# Patient Record
Sex: Female | Born: 1940 | Race: Black or African American | Hispanic: No | Marital: Married | Smoking: Former smoker
Health system: Southern US, Community
[De-identification: ages and names within clinical notes are randomized; demographics above are authoritative.]

## PROBLEM LIST (undated history)

## (undated) DIAGNOSIS — I10 Essential (primary) hypertension: Secondary | ICD-10-CM

## (undated) DIAGNOSIS — C801 Malignant (primary) neoplasm, unspecified: Secondary | ICD-10-CM

## (undated) HISTORY — PX: VAGINAL HYSTERECTOMY: SUR661

## (undated) HISTORY — PX: CATARACT EXTRACTION: SUR2

## (undated) HISTORY — DX: Essential (primary) hypertension: I10

---

## 2004-01-06 ENCOUNTER — Ambulatory Visit: Payer: Self-pay | Admitting: Family Medicine

## 2004-01-20 ENCOUNTER — Other Ambulatory Visit: Admission: RE | Admit: 2004-01-20 | Discharge: 2004-01-20 | Payer: Self-pay | Admitting: Family Medicine

## 2004-01-20 ENCOUNTER — Ambulatory Visit: Payer: Self-pay | Admitting: Family Medicine

## 2004-01-27 ENCOUNTER — Ambulatory Visit: Payer: Self-pay

## 2004-03-16 ENCOUNTER — Encounter: Admission: RE | Admit: 2004-03-16 | Discharge: 2004-03-16 | Payer: Self-pay | Admitting: Family Medicine

## 2004-03-27 ENCOUNTER — Encounter: Admission: RE | Admit: 2004-03-27 | Discharge: 2004-03-27 | Payer: Self-pay | Admitting: Family Medicine

## 2005-02-10 ENCOUNTER — Ambulatory Visit: Payer: Self-pay | Admitting: Family Medicine

## 2005-02-12 ENCOUNTER — Encounter: Admission: RE | Admit: 2005-02-12 | Discharge: 2005-02-12 | Payer: Self-pay | Admitting: Family Medicine

## 2005-04-07 ENCOUNTER — Ambulatory Visit: Payer: Self-pay | Admitting: Internal Medicine

## 2005-04-10 ENCOUNTER — Ambulatory Visit: Payer: Self-pay | Admitting: Pulmonary Disease

## 2005-04-26 ENCOUNTER — Encounter (INDEPENDENT_AMBULATORY_CARE_PROVIDER_SITE_OTHER): Payer: Self-pay | Admitting: Specialist

## 2005-04-26 ENCOUNTER — Ambulatory Visit: Payer: Self-pay | Admitting: Internal Medicine

## 2005-05-06 ENCOUNTER — Encounter: Payer: Self-pay | Admitting: Family Medicine

## 2005-05-06 ENCOUNTER — Ambulatory Visit: Payer: Self-pay | Admitting: Family Medicine

## 2005-05-06 ENCOUNTER — Other Ambulatory Visit: Admission: RE | Admit: 2005-05-06 | Discharge: 2005-05-06 | Payer: Self-pay | Admitting: Family Medicine

## 2005-12-23 ENCOUNTER — Ambulatory Visit: Payer: Self-pay | Admitting: Family Medicine

## 2006-05-03 ENCOUNTER — Ambulatory Visit: Payer: Self-pay | Admitting: Family Medicine

## 2006-07-29 DIAGNOSIS — K449 Diaphragmatic hernia without obstruction or gangrene: Secondary | ICD-10-CM | POA: Insufficient documentation

## 2006-08-01 ENCOUNTER — Encounter: Payer: Self-pay | Admitting: Family Medicine

## 2006-08-01 ENCOUNTER — Ambulatory Visit: Payer: Self-pay | Admitting: Family Medicine

## 2006-08-01 ENCOUNTER — Other Ambulatory Visit: Admission: RE | Admit: 2006-08-01 | Discharge: 2006-08-01 | Payer: Self-pay | Admitting: Family Medicine

## 2006-08-01 DIAGNOSIS — Z8611 Personal history of tuberculosis: Secondary | ICD-10-CM

## 2006-08-01 DIAGNOSIS — M25529 Pain in unspecified elbow: Secondary | ICD-10-CM

## 2006-08-09 ENCOUNTER — Encounter: Admission: RE | Admit: 2006-08-09 | Discharge: 2006-08-09 | Payer: Self-pay | Admitting: Family Medicine

## 2006-08-10 ENCOUNTER — Encounter (INDEPENDENT_AMBULATORY_CARE_PROVIDER_SITE_OTHER): Payer: Self-pay | Admitting: *Deleted

## 2006-08-12 LAB — CONVERTED CEMR LAB
ALT: 14 units/L (ref 0–40)
AST: 20 units/L (ref 0–37)
Albumin: 4 g/dL (ref 3.5–5.2)
Alkaline Phosphatase: 67 units/L (ref 39–117)
BUN: 9 mg/dL (ref 6–23)
Basophils Absolute: 0.1 10*3/uL (ref 0.0–0.1)
Calcium: 9.6 mg/dL (ref 8.4–10.5)
Chloride: 108 meq/L (ref 96–112)
Cholesterol: 194 mg/dL (ref 0–200)
Creatinine, Ser: 0.7 mg/dL (ref 0.4–1.2)
GFR calc non Af Amer: 89 mL/min
HCT: 43 % (ref 36.0–46.0)
LDL Cholesterol: 135 mg/dL — ABNORMAL HIGH (ref 0–99)
MCHC: 34.1 g/dL (ref 30.0–36.0)
Monocytes Relative: 9.2 % (ref 3.0–11.0)
Neutrophils Relative %: 36.4 % — ABNORMAL LOW (ref 43.0–77.0)
Platelets: 290 10*3/uL (ref 150–400)
RBC: 4.67 M/uL (ref 3.87–5.11)
RDW: 13.7 % (ref 11.5–14.6)
Total Bilirubin: 0.9 mg/dL (ref 0.3–1.2)
Total CHOL/HDL Ratio: 5.6
Triglycerides: 123 mg/dL (ref 0–149)

## 2008-03-11 IMAGING — CR DG CHEST 2V
2 series · 2 of 2 positions shown · non-contrast
Comparison: 02/12/05.

CLINICAL DATA: Positive PPD.
 TWO VIEW CHEST:

[view not recorded (1 of 2)]
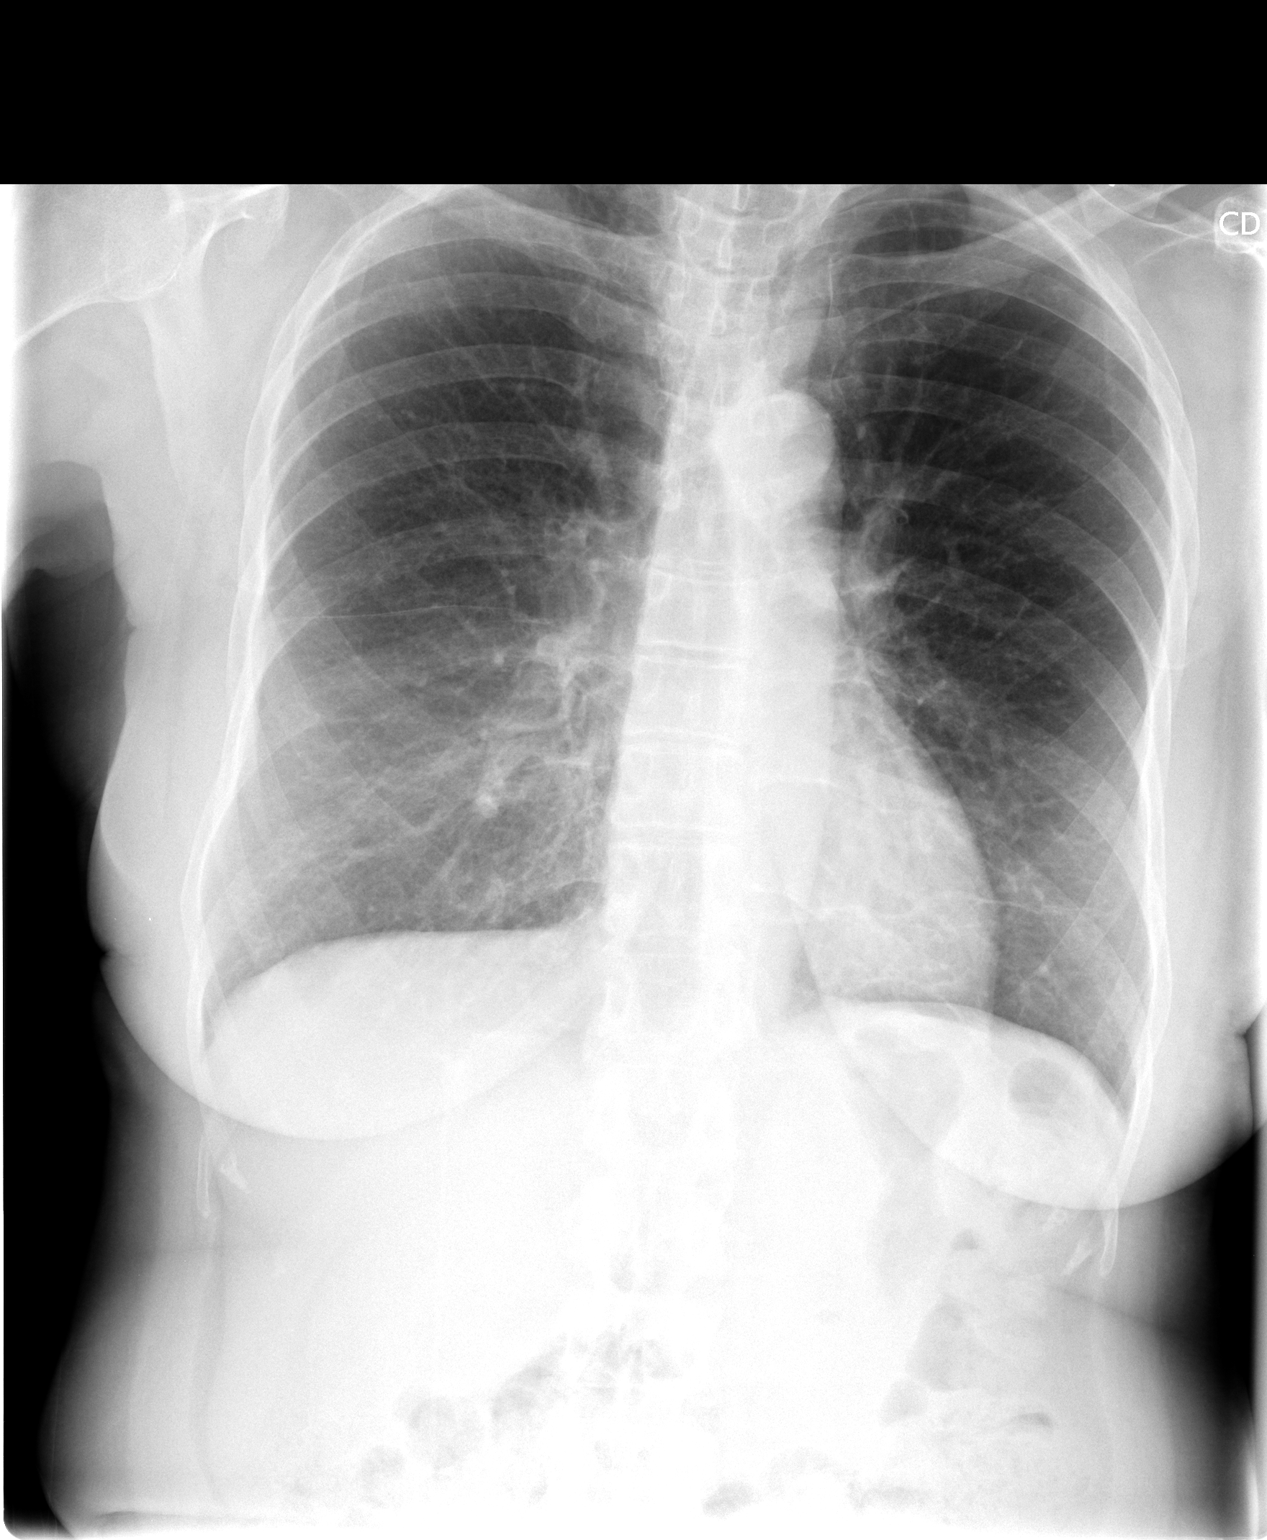

[view not recorded (2 of 2)]
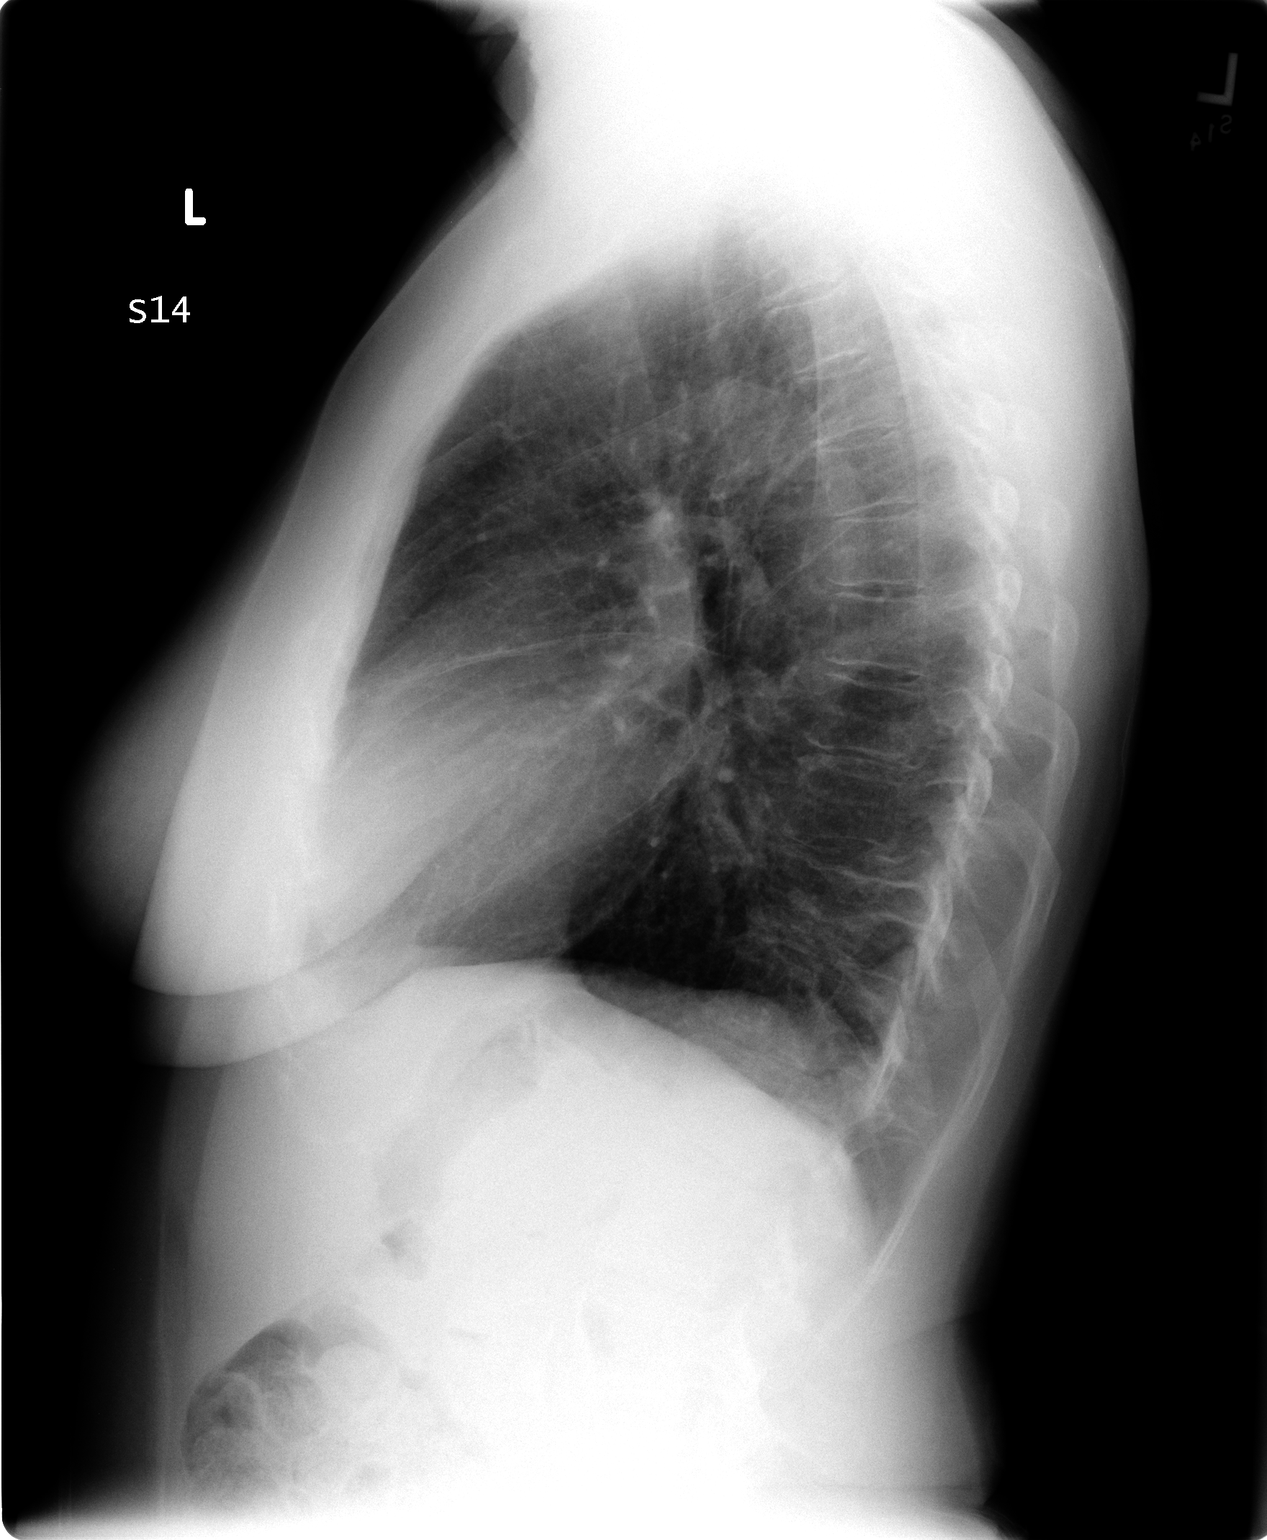

[2 of 2 positions shown; findings below may reference images not displayed]

FINDINGS: Trachea is midline.  Heart size normal.  Right paratracheal soft tissue density is unchanged.  Left basilar subsegmental atelectasis or scar.  Lungs are otherwise clear. No pleural fluid.
IMPRESSION: No evidence of active tuberculosis.

## 2009-05-17 ENCOUNTER — Ambulatory Visit: Payer: Self-pay | Admitting: Diagnostic Radiology

## 2009-05-17 ENCOUNTER — Emergency Department (HOSPITAL_BASED_OUTPATIENT_CLINIC_OR_DEPARTMENT_OTHER): Admission: EM | Admit: 2009-05-17 | Discharge: 2009-05-17 | Payer: Self-pay | Admitting: Emergency Medicine

## 2010-03-22 ENCOUNTER — Encounter: Payer: Self-pay | Admitting: Family Medicine

## 2010-05-20 ENCOUNTER — Encounter: Payer: Self-pay | Admitting: Internal Medicine

## 2010-05-28 NOTE — Letter (Signed)
Summary: Colonoscopy Date Change Letter  [facility]  [address] [place], [place] [address]   Phone: [phone]  Fax: [phone]      May 20, 2010 MRN: [id number]   [facility] [address] [place], [place]  [address]   Dear Ms. SULLIVAN,   Previously you were recommended to have a repeat colonoscopy around this time. Your chart was recently reviewed by Dr. Lina Sar of [facility]. Follow up colonoscopy is now recommended in February 2014. This revised recommendation is based on current, nationally recognized guidelines for colorectal cancer screening and polyp surveillance. These guidelines are endorsed by the American Cancer Society, [employer] on Colorectal Cancer as well as numerous other major medical organizations.  Please understand that our recommendation assumes that you do not have any new symptoms such as bleeding, a change in bowel habits, anemia, or significant abdominal discomfort. If you do have any concerning GI symptoms or want to discuss the guideline recommendations, please call to arrange an office visit at your earliest convenience. Otherwise we will keep you in our reminder system and contact you 1-2 months prior to the date listed above to schedule your next colonoscopy.  Thank you,  Hedwig Morton. Juanda Chance, M.D.  [facility] [phone]

## 2012-02-18 ENCOUNTER — Encounter: Payer: Self-pay | Admitting: Internal Medicine

## 2012-03-29 ENCOUNTER — Encounter: Payer: Self-pay | Admitting: Internal Medicine

## 2012-04-04 ENCOUNTER — Encounter: Payer: Self-pay | Admitting: Internal Medicine

## 2012-09-13 ENCOUNTER — Encounter: Payer: Self-pay | Admitting: Internal Medicine

## 2012-10-27 ENCOUNTER — Ambulatory Visit (AMBULATORY_SURGERY_CENTER): Payer: Federal, State, Local not specified - PPO

## 2012-10-27 VITALS — Ht 65.5 in | Wt 139.8 lb

## 2012-10-27 DIAGNOSIS — Z860100 Personal history of colon polyps, unspecified: Secondary | ICD-10-CM

## 2012-10-27 MED ORDER — MOVIPREP 100 G PO SOLR: 1.0000 | Freq: Once | ORAL | Status: DC

## 2012-11-01 ENCOUNTER — Encounter: Payer: Self-pay | Admitting: Internal Medicine

## 2012-11-10 ENCOUNTER — Encounter: Payer: Self-pay | Admitting: Internal Medicine

## 2012-11-10 ENCOUNTER — Ambulatory Visit (AMBULATORY_SURGERY_CENTER): Payer: Medicare Other | Admitting: Internal Medicine

## 2012-11-10 VITALS — BP 135/66 | HR 79 | Temp 97.1°F | Resp 18 | Ht 65.5 in | Wt 139.0 lb

## 2012-11-10 DIAGNOSIS — D126 Benign neoplasm of colon, unspecified: Secondary | ICD-10-CM

## 2012-11-10 DIAGNOSIS — Z86010 Personal history of colonic polyps: Secondary | ICD-10-CM

## 2012-11-10 MED ORDER — SODIUM CHLORIDE 0.9 % IV SOLN: 500.0000 mL | INTRAVENOUS | Status: DC

## 2012-11-10 NOTE — Op Note (Signed)
[facility] [address] [place], [address]   COLONOSCOPY PROCEDURE REPORT  PATIENT: Alyssa Gibson, Alyssa Gibson  MR#: [id number] BIRTHDATE: [birth year] , 72  yrs. old GENDER: Female ENDOSCOPIST: Hart Carwin, MD REFERRED QI:ONGEXBM Willa Rough, M.D. PROCEDURE DATE:  11/10/2012 PROCEDURE:   Colonoscopy, surveillance First Screening Colonoscopy - Avg.  risk and is 50 yrs.  old or older - No.  Prior Negative Screening - Now for repeat screening. N/A  History of Adenoma - Now for follow-up colonoscopy & has been > or = to 3 yrs.  N/A  Polyps Removed Today? Yes. ASA CLASS:   Class II INDICATIONS: hyperplastic polyp in 2007 MEDICATIONS: propofol (Diprivan) 200mg  IV  DESCRIPTION OF PROCEDURE:   After the risks benefits and alternatives of the procedure were thoroughly explained, informed consent was obtained.  A digital rectal exam revealed no abnormalities of the rectum.   The LB [id number]-H190 [id number]  endoscope was introduced through the anus and advanced to the cecum, which was identified by both the appendix and ileocecal valve. No adverse events experienced.   The quality of the prep was good, using MoviPrep  The instrument was then slowly withdrawn as the colon was fully examined.      COLON FINDINGS: A smooth sessile polyp ranging between 3-26mm in size was found.in the rectum.  A polypectomy was performed with cold forceps.  The resection was complete and the polyp tissue was completely retrieved.  Retroflexed views revealed no abnormalities. The time to cecum=6 minutes 10 seconds.  Withdrawal time=6 minutes 7 seconds.  The scope was withdrawn and the procedure completed. COMPLICATIONS: There were no complications.  ENDOSCOPIC IMPRESSION: Sessile polyp ranging between 3-67mm in size was found; polypectomy was performed with cold forceps  RECOMMENDATIONS: 1.  Await pathology results 2.   high fiber diet 3.  recall colonoscopy 10 years pending Bx   eSigned:  Hart Carwin, MD 11/10/2012 11:34 AM   cc:

## 2012-11-10 NOTE — Progress Notes (Signed)
Called to room to assist during endoscopic procedure.  Patient ID and intended procedure confirmed with present staff. Received instructions for my participation in the procedure from the performing physician.  

## 2012-11-10 NOTE — Progress Notes (Signed)
A/ox3 pleased with MAC, report to RN 

## 2012-11-10 NOTE — Progress Notes (Deleted)
Patient denies any allergies to eggs or soy. 

## 2012-11-10 NOTE — Patient Instructions (Addendum)
YOU HAD AN ENDOSCOPIC PROCEDURE TODAY AT THE [facility]: Refer to the procedure report that was given to you for any specific questions about what was found during the examination.  If the procedure report does not answer your questions, please call your gastroenterologist to clarify.  If you requested that your care partner not be given the details of your procedure findings, then the procedure report has been included in a sealed envelope for you to review at your convenience later.  YOU SHOULD EXPECT: Some feelings of bloating in the abdomen. Passage of more gas than usual.  Walking can help get rid of the air that was put into your GI tract during the procedure and reduce the bloating. If you had a lower endoscopy (such as a colonoscopy or flexible sigmoidoscopy) you may notice spotting of blood in your stool or on the toilet paper. If you underwent a bowel prep for your procedure, then you may not have a normal bowel movement for a few days.  DIET: Your first meal following the procedure should be a light meal and then it is ok to progress to your normal diet.  A half-sandwich or bowl of soup is an example of a good first meal.  Heavy or fried foods are harder to digest and may make you feel nauseous or bloated.  Likewise meals heavy in dairy and vegetables can cause extra gas to form and this can also increase the bloating.  Drink plenty of fluids but you should avoid alcoholic beverages for 24 hours.  ACTIVITY: Your care partner should take you home directly after the procedure.  You should plan to take it easy, moving slowly for the rest of the day.  You can resume normal activity the day after the procedure however you should NOT DRIVE or use heavy machinery for 24 hours (because of the sedation medicines used during the test).    SYMPTOMS TO REPORT IMMEDIATELY: A gastroenterologist can be reached at any hour.  During normal business hours, 8:30 AM to 5:00 PM Monday through Friday,  call [phone].  After hours and on weekends, please call the GI answering service at [phone] who will take a message and have the physician on call contact you.   Following lower endoscopy (colonoscopy or flexible sigmoidoscopy):  Excessive amounts of blood in the stool  Significant tenderness or worsening of abdominal pains  Swelling of the abdomen that is new, acute  Fever of 100F or higher    FOLLOW UP: If any biopsies were taken you will be contacted by phone or by letter within the next 1-3 weeks.  Call your gastroenterologist if you have not heard about the biopsies in 3 weeks.  Our staff will call the home number listed on your records the next business day following your procedure to check on you and address any questions or concerns that you may have at that time regarding the information given to you following your procedure. This is a courtesy call and so if there is no answer at the home number and we have not heard from you through the emergency physician on call, we will assume that you have returned to your regular daily activities without incident.  SIGNATURES/CONFIDENTIALITY: You and/or your care partner have signed paperwork which will be entered into your electronic medical record.  These signatures attest to the fact that that the information above on your After Visit Summary has been reviewed and is understood.  Full responsibility of the confidentiality   of this discharge information lies with you and/or your care-partner.     

## 2012-11-10 NOTE — Progress Notes (Signed)
Patient denies any allergies to eggs or soy. Patient denies any problems with prior anesthesia.  

## 2012-11-10 NOTE — Progress Notes (Signed)
Patient did not have preoperative order for IV antibiotic SSI prophylaxis. (G8918)  Patient did not experience any of the following events: a burn prior to discharge; a fall within the facility; wrong site/side/patient/procedure/implant event; or a hospital transfer or hospital admission upon discharge from the facility. (G8907)  

## 2012-11-13 ENCOUNTER — Telehealth: Payer: Self-pay

## 2012-11-13 NOTE — Telephone Encounter (Signed)
  Follow up Call-  Call back number 11/10/2012  Post procedure Call Back phone  # [phone]  Permission to leave phone message Yes     Patient questions:  Do you have a fever, pain , or abdominal swelling? no Pain Score  0 *  Have you tolerated food without any problems? yes  Have you been able to return to your normal activities? yes  Do you have any questions about your discharge instructions: Diet   no Medications  no Follow up visit  no  Do you have questions or concerns about your Care? no  Actions: * If pain score is 4 or above: No action needed, pain <4.  Per the pt, "I did fine". Maw

## 2012-11-14 ENCOUNTER — Encounter: Payer: Self-pay | Admitting: Internal Medicine

## 2014-08-05 ENCOUNTER — Encounter: Payer: Self-pay | Admitting: Internal Medicine

## 2022-03-07 ENCOUNTER — Other Ambulatory Visit: Payer: Self-pay

## 2022-03-07 ENCOUNTER — Encounter (HOSPITAL_BASED_OUTPATIENT_CLINIC_OR_DEPARTMENT_OTHER): Payer: Self-pay | Admitting: Emergency Medicine

## 2022-03-07 ENCOUNTER — Emergency Department (HOSPITAL_BASED_OUTPATIENT_CLINIC_OR_DEPARTMENT_OTHER)
Admission: EM | Admit: 2022-03-07 | Discharge: 2022-03-07 | Disposition: A | Discharge status: Home / Self Care | Payer: Medicare Other | Attending: Emergency Medicine | Admitting: Emergency Medicine

## 2022-03-07 DIAGNOSIS — M546 Pain in thoracic spine: Secondary | ICD-10-CM | POA: Insufficient documentation

## 2022-03-07 DIAGNOSIS — M62830 Muscle spasm of back: Secondary | ICD-10-CM | POA: Diagnosis not present

## 2022-03-07 DIAGNOSIS — M549 Dorsalgia, unspecified: Secondary | ICD-10-CM | POA: Diagnosis present

## 2022-03-07 DIAGNOSIS — G8929 Other chronic pain: Secondary | ICD-10-CM | POA: Insufficient documentation

## 2022-03-07 LAB — URINALYSIS, ROUTINE W REFLEX MICROSCOPIC
Bilirubin Urine: NEGATIVE
Glucose, UA: NEGATIVE mg/dL
Hgb urine dipstick: NEGATIVE
Ketones, ur: NEGATIVE mg/dL
Leukocytes,Ua: NEGATIVE
Nitrite: NEGATIVE
Protein, ur: NEGATIVE mg/dL
Specific Gravity, Urine: 1.02 (ref 1.005–1.030)
pH: 5.5 (ref 5.0–8.0)

## 2022-03-07 MED ORDER — CYCLOBENZAPRINE HCL 10 MG PO TABS: 10.0000 mg | ORAL_TABLET | Freq: Two times a day (BID) | ORAL | 0 refills | Status: AC | PRN

## 2022-03-07 MED ORDER — METHYLPREDNISOLONE SODIUM SUCC 125 MG IJ SOLR
125.0000 mg | Freq: Once | INTRAMUSCULAR | Status: AC
  Administered 2022-03-07: 125 mg via INTRAMUSCULAR
  Filled 2022-03-07: qty 2

## 2022-03-07 MED ORDER — CYCLOBENZAPRINE HCL 10 MG PO TABS
10.0000 mg | ORAL_TABLET | Freq: Once | ORAL | Status: AC
  Administered 2022-03-07: 10 mg via ORAL
  Filled 2022-03-07: qty 1

## 2022-03-07 NOTE — ED Triage Notes (Signed)
Pt reports back and shoulder spasms for the past 3 weeks. Has been to PCP for this. Pt says the only thing that stopped pain was what she thinks was a cortisone shot. Has had MRI for this but has not gotten result back yet. Also has noticed a foul smell to urine

## 2022-03-07 NOTE — Discharge Instructions (Addendum)
Thank you for allow me to be part of your care today.  You received a steroid injection which will work over the next several days to help with your back and shoulder pain.  We also gave you Flexeril, I am sending you home with a prescription of Flexeril that you will take twice daily as needed for muscle spasms.  I recommend following up with your primary care physician to discuss your MRI results and possible referral for physical therapy to help with your chronic back pain.  I recommend taking 800 mg of ibuprofen every 8 hours as needed for inflammation and pain.  Do not exceed 3200 mg in a 24-hour period as this will increase your risk for adverse effects.  I have attached some rehab exercises for mid back pain that you may try, if it worsens your symptoms, discontinue exercises.   Return to the ER if you develop any worsening of your symptoms or have any new concerns.

## 2022-03-07 NOTE — ED Notes (Signed)
Presents with rt shoulder and left flank pain, intermittent sharp with spasm type demonstration, able to ambulate with assistance, has full ROM of RUE, Able to place wt on both lower extremities. Pain is a 10 out of 0-10 scale at both areas, facial grimacing noted. States she has been dealing with this pain for the past 3 weeks, has been to an MD and rec some injections and changes in medication with minimal relief

## 2022-03-07 NOTE — ED Provider Notes (Signed)
[facility] EMER[facility]ENCY DEPARTMENT Provider Note   CSN: [id number] Arrival date & time: 03/07/22  0911     History  Chief Complaint  Patient presents with   Back Pain    Alyssa Gibson is a 82 y.o. female presents to the ED complaining of back and shoulder pain with spasms that has been ongoing for the past 3 weeks.  Patient has been seen by here PCP for this and had an MRI done at the end of December, but has not gotten her results back yet.  She states that she believes she has received a cortisone shot in the past for the pain and that seemed to help.  She has also noticed a foul smell to her urine and is concerned for UTI.  Denies fall or injury, heavy lifting.  Denies fever, weakness, numbness.        Home Medications Prior to Admission medications   Medication Sig Start Date End Date Taking? Authorizing Provider  cyclobenzaprine (FLEXERIL) 10 MG tablet Take 1 tablet (10 mg total) by mouth 2 (two) times daily as needed for muscle spasms. 03/07/22  Yes Cambri Plourde R, PA  AMLODIPINE BESYLATE PO Take by mouth.    [provider]  cholecalciferol (VITAMIN D) 400 UNITS TABS tablet Take 400 Units by mouth.    [provider]  fish oil-omega-3 fatty acids 1000 MG capsule Take 2 g by mouth daily.    [provider]  vitamin B-12 (CYANOCOBALAMIN) 1000 MCG tablet Take 1,000 mcg by mouth daily.    [provider]      Allergies    Penicillins    Review of Systems   Review of Systems  Constitutional:  Negative for fever.  Musculoskeletal:  Positive for arthralgias, back pain and neck pain.       Back and shoulder muscle spasms  Neurological:  Negative for weakness and numbness.    Physical Exam Updated Vital Signs BP (!) 148/78   Pulse 93   Temp 98.1 F (36.7 C) (Oral)   Resp 20   SpO2 100%  Physical Exam Vitals and nursing note reviewed.  Constitutional:      General: She is not in acute distress.    Appearance: Normal  appearance. She is not ill-appearing or diaphoretic.  Cardiovascular:     Rate and Rhythm: Normal rate and regular rhythm.  Pulmonary:     Effort: Pulmonary effort is normal.  Abdominal:     General: Abdomen is flat. Bowel sounds are normal.     Palpations: Abdomen is soft.     Tenderness: There is no abdominal tenderness.  Musculoskeletal:     Cervical back: No torticollis, tenderness, bony tenderness or crepitus. Pain with movement present. Normal range of motion.     Thoracic back: Spasms, tenderness and bony tenderness present. No deformity. Decreased range of motion.     Lumbar back: No tenderness or bony tenderness.       Back:  Skin:    General: Skin is warm and dry.     Capillary Refill: Capillary refill takes less than 2 seconds.  Neurological:     Mental Status: She is alert and oriented to person, place, and time. Mental status is at baseline.  Psychiatric:        Mood and Affect: Mood normal.        Behavior: Behavior normal.     ED Results / Procedures / Treatments   Labs (all labs ordered are listed, but only abnormal  results are displayed) Labs Reviewed  URINALYSIS, ROUTINE W REFLEX MICROSCOPIC    EKG None  Radiology No results found.  Procedures Procedures    Medications Ordered in ED Medications  methylPREDNISolone sodium succinate (SOLU-MEDROL) 125 mg/2 mL injection 125 mg (125 mg Intramuscular Given 03/07/22 1217)  cyclobenzaprine (FLEXERIL) tablet 10 mg (10 mg Oral Given 03/07/22 1216)    ED Course/ Medical Decision Making/ A&P                           Medical Decision Making Amount and/or Complexity of Data Reviewed Labs: ordered.   This patient presents to the ED with chief complaint(s) of thoracic back pain with right shoulder pain, and foul-smelling urine.  Patient has been evaluated by her primary care provider for similar back pain and muscle spasms.  She has been taking muscle relaxers and had an MRI performed at the end of December.   Patient states that the only thing that stopped the pain in the past was cortisone shot and some other injection she cannot recall.  Patient has to travel to [facility] tomorrow to assist her husband with beginning cancer treatments.  The complaint involves an extensive differential diagnosis and also carries with it a high risk of complications and morbidity.    The differential diagnosis includes spondylosis, spondylolisthesis, osteoarthritis, muscular strain, muscle spasm  The initial plan is to treat patient's pain with steroid injection and muscle relaxers  Additional history obtained: Additional history obtained from family, patient's daughter at bedside Records reviewed  MRI results from 02/24/2022 which shows thoracic spondylosis without significant spinal stenosis.  There is some foraminal narrowing bilaterally due to spurring.  Initial Assessment:   Exam significant for a patient that appears very uncomfortable, but is in no acute distress.  She does have bony tenderness and paraspinal tenderness in the thoracic region along the right scapula.  She is able to pull herself up to a sitting position in bed without assistance, but does experience increased discomfort.  Range of motion of neck is normal.  Range of motion of thoracic spine is limited due to pain.  She has 5/5 strength in bilateral upper and lower extremities.  Independent ECG/labs interpretation:  The following labs were independently interpreted:  Not indicated  Independent visualization and interpretation of imaging: I independently visualized the following imaging with scope of interpretation limited to determining acute life threatening conditions related to emergency care: Not indicated due to patient having a recent MRI done on 02/24/2022 which demonstrated thoracic spondylosis without significant spinal stenosis.  Treatment and Reassessment: Will treat patient while in ED with steroid injection and flexeril to reduce her  pain from muscle spasms. Patient has been taking tizanidine as a muscle relaxer with mixed results.  Upon reassessment, patient reports feeling much better and her pain has significantly improved.  Will prescribe flexeril to replace patient's tizanidine.    Disposition:   The patient has been appropriately medically screened and/or stabilized in the ED. I have low suspicion for any other emergent medical condition which would require further screening, evaluation or treatment in the ED or require inpatient management. At time of discharge the patient is hemodynamically stable and in no acute distress. I have discussed work-up results and diagnosis with patient and answered all questions. Patient is agreeable with discharge plan. We discussed strict return precautions for returning to the emergency department and they verbalized understanding.  Advised patient to schedule follow-up appointment with her primary  care provider to discuss MRI results and possible need for physical therapy referral.  Patient was taking tizanidine with mixed results in muscle spasm control, responded well to Flexeril while in ED.  Will prescribe short course of Flexeril to help with symptoms.  Discussed with patient supportive care measures including the use of heat and ibuprofen for musculoskeletal pain.  Patient verbally expressed her understanding and is in agreement with plan of discharge.          Final Clinical Impression(s) / ED Diagnoses Final diagnoses:  Chronic right-sided thoracic back pain  Muscle spasm of back    Rx / DC Orders ED Discharge Orders          Ordered    cyclobenzaprine (FLEXERIL) 10 MG tablet  2 times daily PRN        03/07/22 1309              Theressa Stamps [place], [place] 03/07/22 1309    Regan Lemming, MD 03/07/22 [phone]

## 2023-04-07 ENCOUNTER — Emergency Department (HOSPITAL_BASED_OUTPATIENT_CLINIC_OR_DEPARTMENT_OTHER): Payer: Medicare Other

## 2023-04-07 ENCOUNTER — Encounter (HOSPITAL_BASED_OUTPATIENT_CLINIC_OR_DEPARTMENT_OTHER): Payer: Self-pay | Admitting: Emergency Medicine

## 2023-04-07 ENCOUNTER — Inpatient Hospital Stay (HOSPITAL_BASED_OUTPATIENT_CLINIC_OR_DEPARTMENT_OTHER)
Admission: EM | Admit: 2023-04-07 | Discharge: 2023-04-10 | DRG: 291 | Disposition: A | Discharge status: Home Health | Payer: Medicare Other | Attending: Internal Medicine | Admitting: Internal Medicine

## 2023-04-07 ENCOUNTER — Other Ambulatory Visit: Payer: Self-pay

## 2023-04-07 DIAGNOSIS — Z9841 Cataract extraction status, right eye: Secondary | ICD-10-CM | POA: Diagnosis not present

## 2023-04-07 DIAGNOSIS — J449 Chronic obstructive pulmonary disease, unspecified: Secondary | ICD-10-CM

## 2023-04-07 DIAGNOSIS — Z902 Acquired absence of lung [part of]: Secondary | ICD-10-CM

## 2023-04-07 DIAGNOSIS — Z79899 Other long term (current) drug therapy: Secondary | ICD-10-CM

## 2023-04-07 DIAGNOSIS — I5021 Acute systolic (congestive) heart failure: Secondary | ICD-10-CM | POA: Diagnosis present

## 2023-04-07 DIAGNOSIS — I509 Heart failure, unspecified: Secondary | ICD-10-CM | POA: Diagnosis not present

## 2023-04-07 DIAGNOSIS — Z634 Disappearance and death of family member: Secondary | ICD-10-CM | POA: Diagnosis not present

## 2023-04-07 DIAGNOSIS — J9601 Acute respiratory failure with hypoxia: Principal | ICD-10-CM

## 2023-04-07 DIAGNOSIS — Z880 Allergy status to penicillin: Secondary | ICD-10-CM | POA: Diagnosis not present

## 2023-04-07 DIAGNOSIS — Z87891 Personal history of nicotine dependence: Secondary | ICD-10-CM

## 2023-04-07 DIAGNOSIS — Z85118 Personal history of other malignant neoplasm of bronchus and lung: Secondary | ICD-10-CM | POA: Diagnosis not present

## 2023-04-07 DIAGNOSIS — I110 Hypertensive heart disease with heart failure: Secondary | ICD-10-CM | POA: Diagnosis present

## 2023-04-07 DIAGNOSIS — I10 Essential (primary) hypertension: Secondary | ICD-10-CM | POA: Diagnosis not present

## 2023-04-07 DIAGNOSIS — G4700 Insomnia, unspecified: Secondary | ICD-10-CM

## 2023-04-07 DIAGNOSIS — Z9842 Cataract extraction status, left eye: Secondary | ICD-10-CM

## 2023-04-07 DIAGNOSIS — Z8611 Personal history of tuberculosis: Secondary | ICD-10-CM

## 2023-04-07 DIAGNOSIS — E785 Hyperlipidemia, unspecified: Secondary | ICD-10-CM | POA: Diagnosis present

## 2023-04-07 DIAGNOSIS — Z90710 Acquired absence of both cervix and uterus: Secondary | ICD-10-CM | POA: Diagnosis not present

## 2023-04-07 DIAGNOSIS — I5031 Acute diastolic (congestive) heart failure: Secondary | ICD-10-CM | POA: Diagnosis not present

## 2023-04-07 HISTORY — DX: Malignant (primary) neoplasm, unspecified: C80.1

## 2023-04-07 LAB — CBC
HCT: 43.7 % (ref 36.0–46.0)
Hemoglobin: 14.4 g/dL (ref 12.0–15.0)
MCH: 29.6 pg (ref 26.0–34.0)
MCHC: 33 g/dL (ref 30.0–36.0)
MCV: 89.7 fL (ref 80.0–100.0)
Platelets: 321 10*3/uL (ref 150–400)
RBC: 4.87 MIL/uL (ref 3.87–5.11)
RDW: 15.4 % (ref 11.5–15.5)
WBC: 10.3 10*3/uL (ref 4.0–10.5)
nRBC: 0 % (ref 0.0–0.2)

## 2023-04-07 LAB — COMPREHENSIVE METABOLIC PANEL
ALT: 15 U/L (ref 0–44)
AST: 20 U/L (ref 15–41)
Albumin: 3.6 g/dL (ref 3.5–5.0)
Alkaline Phosphatase: 54 U/L (ref 38–126)
Anion gap: 6 (ref 5–15)
BUN: 19 mg/dL (ref 8–23)
CO2: 25 mmol/L (ref 22–32)
Calcium: 9 mg/dL (ref 8.9–10.3)
Chloride: 108 mmol/L (ref 98–111)
Creatinine, Ser: 0.83 mg/dL (ref 0.44–1.00)
GFR, Estimated: >60 mL/min (ref >60)
Glucose, Bld: 115 mg/dL — ABNORMAL HIGH (ref 70–99)
Potassium: 4 mmol/L (ref 3.5–5.1)
Sodium: 139 mmol/L (ref 135–145)
Total Bilirubin: 0.9 mg/dL (ref 0.0–1.2)
Total Protein: 6.7 g/dL (ref 6.5–8.1)

## 2023-04-07 LAB — TROPONIN I (HIGH SENSITIVITY)
Troponin I (High Sensitivity): 41 ng/L — ABNORMAL HIGH (ref <18)
Troponin I (High Sensitivity): 76 ng/L — ABNORMAL HIGH (ref <18)

## 2023-04-07 LAB — D-DIMER, QUANTITATIVE: D-Dimer, Quant: 0.4 ug{FEU}/mL (ref 0.00–0.50)

## 2023-04-07 LAB — BRAIN NATRIURETIC PEPTIDE: B Natriuretic Peptide: 457.7 pg/mL — ABNORMAL HIGH (ref 0.0–100.0)

## 2023-04-07 MED ORDER — TRAZODONE HCL 100 MG PO TABS
100.0000 mg | ORAL_TABLET | Freq: Every day | ORAL | Status: DC
  Administered 2023-04-07 – 2023-04-09 (×3): 100 mg via ORAL
  Filled 2023-04-07 (×3): qty 1

## 2023-04-07 MED ORDER — AMLODIPINE BESYLATE 10 MG PO TABS
10.0000 mg | ORAL_TABLET | Freq: Every day | ORAL | Status: DC
  Filled 2023-04-07: qty 1

## 2023-04-07 MED ORDER — ALBUTEROL SULFATE HFA 108 (90 BASE) MCG/ACT IN AERS: 2.0000 | INHALATION_SPRAY | RESPIRATORY_TRACT | Status: DC | PRN

## 2023-04-07 MED ORDER — FUROSEMIDE 10 MG/ML IJ SOLN
40.0000 mg | Freq: Once | INTRAMUSCULAR | Status: AC
  Administered 2023-04-07: 40 mg via INTRAVENOUS
  Filled 2023-04-07: qty 4

## 2023-04-07 MED ORDER — ACETAMINOPHEN 325 MG PO TABS: 650.0000 mg | ORAL_TABLET | ORAL | Status: DC | PRN

## 2023-04-07 MED ORDER — TIMOLOL MALEATE 0.5 % OP SOLN
1.0000 [drp] | Freq: Every day | OPHTHALMIC | Status: DC
  Administered 2023-04-08 – 2023-04-10 (×3): 1 [drp] via OPHTHALMIC
  Filled 2023-04-07: qty 5

## 2023-04-07 MED ORDER — ONDANSETRON HCL 4 MG/2ML IJ SOLN: 4.0000 mg | Freq: Four times a day (QID) | INTRAMUSCULAR | Status: DC | PRN

## 2023-04-07 MED ORDER — SODIUM CHLORIDE 0.9% FLUSH
3.0000 mL | Freq: Two times a day (BID) | INTRAVENOUS | Status: DC
  Administered 2023-04-07 – 2023-04-10 (×7): 3 mL via INTRAVENOUS

## 2023-04-07 MED ORDER — IPRATROPIUM-ALBUTEROL 0.5-2.5 (3) MG/3ML IN SOLN: 3.0000 mL | RESPIRATORY_TRACT | Status: DC | PRN

## 2023-04-07 MED ORDER — ENOXAPARIN SODIUM 40 MG/0.4ML IJ SOSY
40.0000 mg | PREFILLED_SYRINGE | Freq: Every day | INTRAMUSCULAR | Status: DC
  Administered 2023-04-07 – 2023-04-10 (×4): 40 mg via SUBCUTANEOUS
  Filled 2023-04-07 (×4): qty 0.4

## 2023-04-07 MED ORDER — SODIUM CHLORIDE 0.9% FLUSH: 3.0000 mL | INTRAVENOUS | Status: DC | PRN

## 2023-04-07 MED ORDER — CYCLOBENZAPRINE HCL 10 MG PO TABS: 10.0000 mg | ORAL_TABLET | Freq: Two times a day (BID) | ORAL | Status: DC | PRN

## 2023-04-07 MED ORDER — ALBUTEROL SULFATE (2.5 MG/3ML) 0.083% IN NEBU: 2.5000 mg | INHALATION_SOLUTION | RESPIRATORY_TRACT | Status: DC | PRN

## 2023-04-07 MED ORDER — SODIUM CHLORIDE 0.9 % IV SOLN: 250.0000 mL | INTRAVENOUS | Status: AC | PRN

## 2023-04-07 MED ORDER — FUROSEMIDE 10 MG/ML IJ SOLN
40.0000 mg | Freq: Two times a day (BID) | INTRAMUSCULAR | Status: DC
  Administered 2023-04-07: 40 mg via INTRAVENOUS
  Filled 2023-04-07 (×2): qty 4

## 2023-04-07 NOTE — ED Notes (Signed)
 Transport truck arrived, transport truck had to be redirected. New transport truck contacted

## 2023-04-07 NOTE — ED Triage Notes (Signed)
 Pt has been having SOB for "a while" with exertion, worse at the beginning of the week. Seen by PCP and given meds. Was called and told may have a CHF and has an ECHO scheduled. State SOB is worse and unable to sleep.

## 2023-04-07 NOTE — ED Provider Notes (Addendum)
 [place] EMERGENCY DEPARTMENT AT [facility] Provider Note  CSN: [id number] Arrival date & time: 04/07/23 0414  Chief Complaint(s) Shortness of Breath  HPI Alyssa Gibson is a 83 y.o. female with a past medical history listed below including COPD not on supplemental oxygen, prior lung cancer status post lobectomy not requiring chemo or radiation.  She presents today for shortness of breath.  Ongoing for several months and worse with exertion.  Also endorses orthopnea.  Became more severe 3 days ago prompting a visit to her PCP.  There were concern for possible heart failure and scheduled her for echo and stress test.  Patient endorses productive cough without fevers.  No associated chest pain.  She does endorse dependent lower extremity edema improved by leg raising.  The history is provided by the patient.   On review of records, echo from 2023 showed a EF of 50 to 55%.  Patient also had a reassuring stress test in 2019.  Past Medical History Past Medical History:  Diagnosis Date   Cancer [facility])    Hypertension    Patient Active Problem List   Diagnosis Date Noted   ELBOW PAIN, LEFT 08/01/2006   HX, PERSONAL, TUBERCULOSIS 08/01/2006   Diaphragmatic hernia 07/29/2006   Home Medication(s) Prior to Admission medications   Medication Sig Start Date End Date Taking? Authorizing Provider  AMLODIPINE  BESYLATE PO Take by mouth.    [provider]  cholecalciferol (VITAMIN D) 400 UNITS TABS tablet Take 400 Units by mouth.    [provider]  cyclobenzaprine  (FLEXERIL ) 10 MG tablet Take 1 tablet (10 mg total) by mouth 2 (two) times daily as needed for muscle spasms. 03/07/22   Clark, Meghan R, PA-C  fish oil-omega-3 fatty acids 1000 MG capsule Take 2 g by mouth daily.    [provider]  vitamin B-12 (CYANOCOBALAMIN) 1000 MCG tablet Take 1,000 mcg by mouth daily.    [provider]                                                                                                                                     Allergies Penicillins  Review of Systems Review of Systems As noted in HPI  Physical Exam Vital Signs  I have reviewed the triage vital signs BP (!) 144/91   Pulse (!) 104   Temp 98.3 F (36.8 C) (Oral)   Resp (!) 25   Ht 5' 5.5 (1.664 m)   Wt 58.1 kg   SpO2 96%   BMI 20.98 kg/m   Physical Exam Vitals reviewed.  Constitutional:      General: She is not in acute distress.    Appearance: She is well-developed. She is not diaphoretic.  HENT:     Head: Normocephalic and atraumatic.     Nose: Nose normal.  Eyes:     General: No scleral icterus.       Right eye: No  discharge.        Left eye: No discharge.     Conjunctiva/sclera: Conjunctivae normal.     Pupils: Pupils are equal, round, and reactive to light.  Cardiovascular:     Rate and Rhythm: Regular rhythm. Tachycardia present.     Heart sounds: No murmur heard.    No friction rub. No gallop.  Pulmonary:     Effort: Pulmonary effort is normal. No respiratory distress.     Breath sounds: No stridor. Examination of the right-upper field reveals wheezing. Examination of the left-upper field reveals wheezing. Examination of the left-middle field reveals rhonchi. Examination of the right-lower field reveals rhonchi. Wheezing and rhonchi present. No rales.  Abdominal:     General: There is no distension.     Palpations: Abdomen is soft.     Tenderness: There is no abdominal tenderness.  Musculoskeletal:        General: No tenderness.     Cervical back: Normal range of motion and neck supple.     Right lower leg: No edema.     Left lower leg: No edema.  Skin:    General: Skin is warm and dry.     Findings: No erythema or rash.  Neurological:     Mental Status: She is alert and oriented to person, place, and time.     ED Results and Treatments Labs (all labs ordered are listed, but only abnormal results are displayed) Labs Reviewed  BRAIN  NATRIURETIC PEPTIDE - Abnormal; Notable for the following components:      Result Value   B Natriuretic Peptide 457.7 (*)    All other components within normal limits  COMPREHENSIVE METABOLIC PANEL - Abnormal; Notable for the following components:   Glucose, Bld 115 (*)    All other components within normal limits  TROPONIN I (HIGH SENSITIVITY) - Abnormal; Notable for the following components:   Troponin I (High Sensitivity) 41 (*)    All other components within normal limits  CBC  D-DIMER, QUANTITATIVE (NOT AT [facility])  TROPONIN I (HIGH SENSITIVITY)                                                                                                                         EKG  EKG Interpretation Date/Time:  Thursday April 07 2023 04:32:45 EST Ventricular Rate:  120 PR Interval:  136 QRS Duration:  108 QT Interval:  299 QTC Calculation: 423 R Axis:   0  Text Interpretation: Sinus tachycardia Ventricular premature complex Probable anteroseptal infarct, recent Lateral leads are also involved No old tracing to compare Confirmed by Trine Likes [phone]) on 04/07/2023 5:15:58 AM       Radiology DG Chest 2 View Result Date: 04/07/2023 CLINICAL DATA:  Shortness of breath. EXAM: CHEST - 2 VIEW COMPARISON:  08/09/2006 FINDINGS: Cardiopericardial silhouette is at upper limits of normal for size. Basilar predominant interstitial with some potential minimal airspace disease at the right base. No substantial pleural effusion. Bones are diffusely demineralized.  Telemetry leads overlie the chest. IMPRESSION: Basilar predominant interstitial opacity bilaterally, right greater than left. Findings likely reflect chronic changes although a component of superimposed edema is not excluded. Subtle airspace disease in the extreme right lung base compatible with atelectasis or infection. Electronically Signed   By: Camellia Candle M.D.   On: 04/07/2023 05:12    Medications Ordered in ED Medications  albuterol   (VENTOLIN  HFA) 108 (90 Base) MCG/ACT inhaler 2 puff (has no administration in time range)  furosemide  (LASIX ) injection 40 mg (40 mg Intravenous Given 04/07/23 9362)   Procedures Procedures  (including critical care time) Medical Decision Making / ED Course   Medical Decision Making Amount and/or Complexity of Data Reviewed Labs: ordered. Decision-making details documented in ED Course. Radiology: ordered and independent interpretation performed. Decision-making details documented in ED Course. ECG/medicine tests: ordered and independent interpretation performed. Decision-making details documented in ED Course.  Risk Prescription drug management. Decision regarding hospitalization.    Shortness of breath differential diagnosis and workup considered  Patient is satting 90% on room air.  Improved on 2 L nasal cannula.  Noted to be tachycardic. Workup favors heart failure over COPD, though possible superimposed.  Appears to be new onset HF.  Started on IV Lasix . Will need admission for further workup and management.  EKG shows sinus tachycardia without acute ischemic changes.  Troponin was slightly elevated 41, favored to be demand. Dimer was negative making PE unlikely.  CBC without leukocytosis or severe anemia.  Metabolic panel without significant electrolyte derangements or renal sufficiency.   Clinical Course as of 04/07/23 0731  Thu Apr 07, 2023  0729 Spoke with Dr. Claudene from Hospitalist service. [PC]    Clinical Course User Index [PC] Lenix Benoist, Raynell Moder, MD    Final Clinical Impression(s) / ED Diagnoses Final diagnoses:  Acute respiratory failure with hypoxia (HCC)  Acute congestive heart failure, unspecified heart failure type [facility])    This chart was dictated using voice recognition software.  Despite best efforts to proofread,  errors can occur which can change the documentation meaning.      Trine Raynell Moder, MD 04/07/23 [phone]

## 2023-04-07 NOTE — Plan of Care (Addendum)
 [facility] transfer for admission at the request of Dr. Trine. Alyssa Gibson is a 83 year old female pmh COPD, lung cancer s/p lobectomy approximately 5 years ago w/o need of radiation or chemo, presented with shortness of breath, peripheral edema, and orthopnea.  Patient saw PCP this week who ordered outpatient echo and stress test, but symptoms worsen for which [facility].  O2 saturations noted to be as low as 90% for which patient was placed on supplemental oxygen, with improvement.  Labs significant for BNP 457.7,  HS-trop 41->76, D-dimer reassuring at 0.4. Chest x-ray significant for bibasilar predominant interstitial opacities bilaterally right greater than left thought to reflect edema, and subtle airspace disease in the extreme right lung base compatible with atelectasis or infection.  Patient was given albuterol  breathing treatment and Lasix  40 mg IV.  Orders placed for inpatient to a cardiac telemetry bed for treatment of presumed CHF exacerbation.

## 2023-04-07 NOTE — ED Notes (Signed)
 ED Provider at bedside.

## 2023-04-07 NOTE — Plan of Care (Signed)
  Problem: Education: Goal: Knowledge of General Education information will improve Description: Including pain rating scale, medication(s)/side effects and non-pharmacologic comfort measures Outcome: Not Progressing   Problem: Health Behavior/Discharge Planning: Goal: Ability to manage health-related needs will improve Outcome: Not Progressing   Problem: Clinical Measurements: Goal: Ability to maintain clinical measurements within normal limits will improve Outcome: Not Progressing Goal: Will remain free from infection Outcome: Not Progressing Goal: Diagnostic test results will improve Outcome: Not Progressing Goal: Respiratory complications will improve Outcome: Not Progressing Goal: Cardiovascular complication will be avoided Outcome: Not Progressing   Problem: Activity: Goal: Risk for activity intolerance will decrease Outcome: Not Progressing   Problem: Nutrition: Goal: Adequate nutrition will be maintained Outcome: Not Progressing   Problem: Coping: Goal: Level of anxiety will decrease Outcome: Not Progressing   Problem: Elimination: Goal: Will not experience complications related to bowel motility Outcome: Not Progressing Goal: Will not experience complications related to urinary retention Outcome: Not Progressing   Problem: Pain Managment: Goal: General experience of comfort will improve and/or be controlled Outcome: Not Progressing   Problem: Safety: Goal: Ability to remain free from injury will improve Outcome: Not Progressing   Problem: Skin Integrity: Goal: Risk for impaired skin integrity will decrease Outcome: Not Progressing   Problem: Education: Goal: Ability to demonstrate management of disease process will improve Outcome: Not Progressing Goal: Ability to verbalize understanding of medication therapies will improve Outcome: Not Progressing Goal: Individualized Educational Video(s) Outcome: Not Progressing   Problem: Activity: Goal:  Capacity to carry out activities will improve Outcome: Not Progressing   Problem: Cardiac: Goal: Ability to achieve and maintain adequate cardiopulmonary perfusion will improve Outcome: Not Progressing

## 2023-04-07 NOTE — H&P (Signed)
 History and Physical    Patient: Alyssa Gibson FMW:[id number] DOB: February 22, 1941 DOA: 04/07/2023 DOS: the patient was seen and examined on 04/07/2023 PCP: Vinie Allean BIRCH, MD  [facility]: [facility]   Chief Complaint:  Chief Complaint  Patient presents with   Shortness of Breath   HPI: Alyssa Gibson is a 83 y.o. female with medical history significant of hypertension, COPD, lung cancer s/p lobectomy approximately 5 years ago w/o need of radiation or chemo, presented with shortness of breath, peripheral edema, and orthopnea.  She has been experiencing progressively worsening shortness of breath over the past year, which has made activities such as walking up steps difficult. Patient saw her primary last week who ordered outpatient echo and stress test.Last night, she experienced an acute episode of breathlessness, waking up unable to breathe, which led to her being brought to the hospital.  Approximately 4 months ago her husband passed away and she really had not been able to care for herself.  At [facility] O2 saturations noted to be as low as 90% for which patient was placed on supplemental oxygen, with improvement.  Labs significant for BNP 457.7,  HS-trop 41->76, D-dimer reassuring at 0.4. Chest x-ray significant for bibasilar predominant interstitial opacities bilaterally right greater than left thought to reflect edema, and subtle airspace disease in the extreme right lung base compatible with atelectasis or infection.  Patient was given albuterol  breathing treatment and Lasix  40 mg IV.  Orders placed for inpatient to a cardiac telemetry bed for treatment of presumed CHF exacerbation.   Review of Systems: As mentioned in the history of present illness. All other systems reviewed and are negative. Past Medical History:  Diagnosis Date   Cancer (HCC)    Hypertension    Past Surgical History:  Procedure Laterality Date   CATARACT EXTRACTION     bilat   VAGINAL  HYSTERECTOMY     Social History:  reports that she quit smoking about 10 years ago. Her smoking use included cigarettes. She has never used smokeless tobacco. She reports that she does not drink alcohol and does not use drugs.  Allergies  Allergen Reactions   Penicillins Hives    Family History  Problem Relation Age of Onset   Colon cancer Neg Hx    Pancreatic cancer Neg Hx    Rectal cancer Neg Hx    Stomach cancer Neg Hx     Prior to Admission medications   Medication Sig Start Date End Date Taking? Authorizing Provider  AMLODIPINE  BESYLATE PO Take by mouth.    [provider]  cholecalciferol (VITAMIN D) 400 UNITS TABS tablet Take 400 Units by mouth.    [provider]  cyclobenzaprine  (FLEXERIL ) 10 MG tablet Take 1 tablet (10 mg total) by mouth 2 (two) times daily as needed for muscle spasms. 03/07/22   Clark, Meghan R, PA-C  fish oil-omega-3 fatty acids 1000 MG capsule Take 2 g by mouth daily.    [provider]  vitamin B-12 (CYANOCOBALAMIN) 1000 MCG tablet Take 1,000 mcg by mouth daily.    [provider]    Physical Exam: Vitals:   04/07/23 1000 04/07/23 1100 04/07/23 1200 04/07/23 1325  BP: 112/77 123/66 134/74 (!) 138/95  Pulse: (!) 52 94 99 (!) 111  Resp: (!) 26 (!) 22 (!) 22 18  Temp:    98.2 F (36.8 C)  TempSrc:    Oral  SpO2: 98% 97% 96% 100%  Weight:    56.5 kg  Height:  5' 5.5 (1.664 m)     Constitutional: Elderly female who appears to be in no acute distress Eyes: PERRL, lids and conjunctivae normal ENMT: Mucous membranes are moist. Normal dentition.  Neck: normal, supple, no masses, JVD present. Respiratory: Normal respiratory effort with some crackles appreciated in mid to lower lung fields. Cardiovascular: Regular rate and rhythm, no murmurs / rubs / gallops. No extremity edema. 2+ pedal pulses. No carotid bruits.  Abdomen: no tenderness, no masses palpated. No hepatosplenomegaly. Bowel sounds positive.   Musculoskeletal: no clubbing / cyanosis. No joint deformity upper and lower extremities. Good ROM, no contractures. Normal muscle tone.  Skin: no rashes, lesions, ulcers. No induration Neurologic: CN 2-12 grossly intact. Sensation intact, DTR normal. Strength 5/5 in all 4.  Psychiatric: Normal judgment and insight. Alert and oriented x 3. Normal mood.   Data Reviewed:  EKG revealed sinus tachycardia 120 bpm.  Reviewed labs, imaging, and pertinent records as documented.  Assessment and Plan:  Acute respiratory failure with hypoxia secondary to new onset congestive heart failure Patient presents with complaints of progressive dyspnea over the last year.  On physical exam patient noted to have JVD and crackles appreciated in both lung fields.  Chest x-ray revealed bibasilar interstitial right greater than left.  Patient having given Lasix  40 mg IV.  Prior history of congestive heart failure. -Admit to a cardiac telemetry bed -Heart failure order set utilized -Strict I&Os and daily weight -Lasix  40 mg IV twice daily -PT consulted to evaluate and treat -Paged Dr. Levern of cardiology, but no response back  will need to reattempt in a.m.  Essential hypertension Blood pressures initially elevated up to 163/102.  Home blood pressure regimen includes amlodipine . -Continue amlodipine   COPD Patient was noted to have some wheezing on physical exam. 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  Insomnia -Continue trazodone    DVT prophylaxis: Lovenox  Advance Care Planning:   Code Status: Full Code   Consults:   Family Communication: Patient's daughter was updated at bedside  Severity of Illness: The appropriate patient status for this patient is INPATIENT. Inpatient status is judged to be reasonable and necessary in order to provide the required intensity of  service to ensure the patient's safety. The patient's presenting symptoms, physical exam findings, and initial radiographic and laboratory data in the context of their chronic comorbidities is felt to place them at high risk for further clinical deterioration. Furthermore, it is not anticipated that the patient will be medically stable for discharge from the hospital within 2 midnights of admission.   * I certify that at the point of admission it is my clinical judgment that the patient will require inpatient hospital care spanning beyond 2 midnights from the point of admission due to high intensity of service, high risk for further deterioration and high frequency of surveillance required.*  Author: Maximino DELENA Sharps, MD 04/07/2023 1:48 PM  For on call review [email or web address].

## 2023-04-08 ENCOUNTER — Inpatient Hospital Stay (HOSPITAL_COMMUNITY): Payer: Medicare Other

## 2023-04-08 ENCOUNTER — Other Ambulatory Visit (HOSPITAL_COMMUNITY): Payer: Medicare Other

## 2023-04-08 DIAGNOSIS — I5031 Acute diastolic (congestive) heart failure: Secondary | ICD-10-CM | POA: Diagnosis not present

## 2023-04-08 DIAGNOSIS — J9601 Acute respiratory failure with hypoxia: Secondary | ICD-10-CM | POA: Diagnosis not present

## 2023-04-08 DIAGNOSIS — I509 Heart failure, unspecified: Secondary | ICD-10-CM | POA: Diagnosis not present

## 2023-04-08 LAB — ECHOCARDIOGRAM COMPLETE
AR max vel: 1.42 cm2
AV Area VTI: 1.4 cm2
AV Area mean vel: 1.3 cm2
AV Mean grad: 4 mm[Hg]
AV Peak grad: 6.6 mm[Hg]
Ao pk vel: 1.28 m/s
Area-P 1/2: 10 cm2
Calc EF: 37.9 %
Height: 65.5 in
P 1/2 time: 530 ms
S' Lateral: 4 cm
Single Plane A2C EF: 33.2 %
Single Plane A4C EF: 42 %
Weight: 1964.74 [oz_av]

## 2023-04-08 LAB — BASIC METABOLIC PANEL
Anion gap: 11 (ref 5–15)
BUN: 17 mg/dL (ref 8–23)
CO2: 26 mmol/L (ref 22–32)
Calcium: 8.8 mg/dL — ABNORMAL LOW (ref 8.9–10.3)
Chloride: 103 mmol/L (ref 98–111)
Creatinine, Ser: 0.97 mg/dL (ref 0.44–1.00)
GFR, Estimated: 58 mL/min — ABNORMAL LOW (ref >60)
Glucose, Bld: 110 mg/dL — ABNORMAL HIGH (ref 70–99)
Potassium: 3.4 mmol/L — ABNORMAL LOW (ref 3.5–5.1)
Sodium: 140 mmol/L (ref 135–145)

## 2023-04-08 LAB — TSH: TSH: 1.218 u[IU]/mL (ref 0.350–4.500)

## 2023-04-08 MED ORDER — METOPROLOL SUCCINATE ER 25 MG PO TB24
25.0000 mg | ORAL_TABLET | Freq: Every day | ORAL | Status: DC
  Administered 2023-04-08 – 2023-04-10 (×3): 25 mg via ORAL
  Filled 2023-04-08 (×3): qty 1

## 2023-04-08 MED ORDER — SPIRONOLACTONE 12.5 MG HALF TABLET
12.5000 mg | ORAL_TABLET | Freq: Every day | ORAL | Status: DC
Start: 2023-04-08 — End: 2023-04-10
  Administered 2023-04-08 – 2023-04-10 (×3): 12.5 mg via ORAL
  Filled 2023-04-08 (×3): qty 1

## 2023-04-08 MED ORDER — POTASSIUM CHLORIDE CRYS ER 20 MEQ PO TBCR
60.0000 meq | EXTENDED_RELEASE_TABLET | Freq: Once | ORAL | Status: AC
Start: 2023-04-08 — End: 2023-04-08
  Administered 2023-04-08: 60 meq via ORAL
  Filled 2023-04-08: qty 3

## 2023-04-08 MED ORDER — FUROSEMIDE 10 MG/ML IJ SOLN
40.0000 mg | Freq: Every day | INTRAMUSCULAR | Status: DC
  Administered 2023-04-09: 40 mg via INTRAVENOUS
  Filled 2023-04-08: qty 4

## 2023-04-08 NOTE — Progress Notes (Signed)
 Heart Failure Navigator Progress Note  Assessed for Heart & Vascular TOC clinic readiness.  Patient EF 20-25%, Per Dr. Levern, patient to follow up with [facility]. .   Navigator will sign off at this time.   Stephane Haddock, BSN, Scientist, Clinical (histocompatibility And Immunogenetics) Only

## 2023-04-08 NOTE — TOC Initial Note (Signed)
 Transition of Care [facility]) - Initial/Assessment Note    Patient Details  Name: Zonnique Norkus MRN: [id number] Date of Birth: 12/01/1940  Transition of [facility]) CM/SW Contact:    Waddell Barnie Rama, RN Phone Number: 04/08/2023, 2:14 PM  Clinical Narrative:                 From home alone, indep, has PCP, Dr. Tobias Sharps and insurance on file, states has no HH services in place at this time or DME at home.   Per pt eval rec HHPT, NCM offered choice to patient , her DIL chose [facility], NCM made referral to [facility] with [facility] for [facility] for CHF disease management and HHPT.  She is able to take referral.  Soc will begin 24 to 48 hrs post dc.  NCM offered choice for DME  for rollator.  She has no preference.  NCM made referral to [facility] with [employer].  States family member will transport them home at [employer] and family is support system, states gets medications from [place] on [address].  Pta self ambulatory.   Expected Discharge Plan: Home w Home Health Services Barriers to Discharge: Continued Medical Work up   Patient Goals and CMS Choice Patient states their goals for this hospitalization and ongoing recovery are:: return home CMS Medicare.gov Compare Post Acute Care list provided to:: Patient Choice offered to / list presented to : Patient      Expected Discharge Plan and Services In-house Referral: NA Discharge Planning Services: CM Consult Post Acute Care Choice: Home Health, Durable Medical Equipment Living arrangements for the past 2 months: Single Family Home                 DME Arranged: N/A DME Agency: NA       HH Arranged: RN, Disease Management, PT HH Agency: [facility] Date HH Agency Contacted: 04/08/23 Time HH Agency Contacted: 1413 Representative spoke with at [facility] Agency: Arna  Prior Living Arrangements/Services Living arrangements for the past 2 months: Single Family Home Lives with:: Self Patient language and need for interpreter reviewed::  Yes Do you feel safe going back to the place where you live?: Yes      Need for Family Participation in Patient Care: Yes (Comment) Care giver support system in place?: Yes (comment)   Criminal Activity/Legal Involvement Pertinent to Current Situation/Hospitalization: No - Comment as needed  Activities of Daily Living   ADL Screening (condition at time of admission) Independently performs ADLs?: Yes (appropriate for developmental age) Is the patient deaf or have difficulty hearing?: No Does the patient have difficulty seeing, even when wearing glasses/contacts?: No Does the patient have difficulty concentrating, remembering, or making decisions?: No  Permission Sought/Granted Permission sought to share information with : Case Manager Permission granted to share information with : Yes, Verbal Permission Granted     Permission granted to share info w AGENCY: HH, DME        Emotional Assessment Appearance:: Appears stated age Attitude/Demeanor/Rapport: Engaged Affect (typically observed): Appropriate Orientation: : Oriented to Self, Oriented to Place, Oriented to  Time, Oriented to Situation   Psych Involvement: No (comment)  Admission diagnosis:  Acute respiratory failure with hypoxia (HCC) [J96.01] New onset of congestive heart failure (HCC) [I50.9] Acute congestive heart failure, unspecified heart failure type [facility]) [I50.9] Patient Active Problem List   Diagnosis Date Noted   New onset of congestive heart failure (HCC) 04/07/2023   Acute respiratory failure with hypoxia (HCC) 04/07/2023   Essential hypertension  04/07/2023   COPD (chronic obstructive pulmonary disease) (HCC) 04/07/2023   History of lung cancer 04/07/2023   Insomnia 04/07/2023   ELBOW PAIN, LEFT 08/01/2006   HX, PERSONAL, TUBERCULOSIS 08/01/2006   Diaphragmatic hernia 07/29/2006   PCP:  Vinie Allean BIRCH, MD Pharmacy:   [id number] - [place], [place] - [address] [place] [address] Phone: [phone] Fax: [phone]     Social Drivers of Health (SDOH) Social History: SDOH Screenings   Food Insecurity: No Food Insecurity (04/07/2023)  Housing: Low Risk  (04/07/2023)  Transportation Needs: No Transportation Needs (04/07/2023)  Utilities: Not At Risk (04/07/2023)  Tobacco Use: Medium Risk (04/07/2023)   SDOH Interventions:     Readmission Risk Interventions     No data to display

## 2023-04-08 NOTE — Progress Notes (Signed)
  Progress Note   Patient: Alyssa Gibson FMW:[id number] DOB: 06-09-1940 DOA: 04/07/2023     1 DOS: the patient was seen and examined on 04/08/2023   Brief hospital course: 83 y.o. female with medical history significant of hypertension, COPD, lung cancer s/p lobectomy approximately 5 years ago w/o need of radiation or chemo, presented with shortness of breath, peripheral edema, and orthopnea.  She has been experiencing progressively worsening shortness of breath over the past year, which has made activities such as walking up steps difficult. Patient saw her primary last week who ordered outpatient echo and stress test.Last night, she experienced an acute episode of breathlessness, waking up unable to breathe, which led to her being brought to the hospital.  Approximately 4 months ago her husband passed away and she really had not been able to care for herself.   At [facility] O2 saturations noted to be as low as 90% for which patient was placed on supplemental oxygen, with improvement.  Labs significant for BNP 457.7,  HS-trop 41->76, D-dimer reassuring at 0.4. Chest x-ray significant for bibasilar predominant interstitial opacities bilaterally right greater than left thought to reflect edema, and subtle airspace disease in the extreme right lung base compatible with atelectasis or infection.  Patient was given albuterol  breathing treatment and Lasix  40 mg IV.  Orders placed for inpatient to a cardiac telemetry bed for treatment of presumed CHF exacerbation.   Assessment and Plan: Acute respiratory failure with hypoxia secondary to new onset congestive heart failure -Presented with evidence of acute heart failure and vol overload. -continued on IV lasix  daily 40mg  -Cardiology following. Recs to start metoprolol  12.5mg  metoprolol , add spironolactone  12.5mg  qday -2d echo reviewed. 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   Insomnia -Continue trazodone          Subjective: Feeling sad over the passing of her husband 4 months ago. Pt tearful  Physical Exam: Vitals:   04/08/23 0448 04/08/23 0736 04/08/23 1025 04/08/23 1139  BP: (!) 109/59 107/89  110/68  Pulse: 66 68 (!) 106 81  Resp: 16 17  18   Temp: 97.8 F (36.6 C) 98.1 F (36.7 C)  98.1 F (36.7 C)  TempSrc: Oral Oral  Oral  SpO2: 100% 100% 91% 100%  Weight: 55.7 kg     Height:       General exam: Awake, laying in bed, in nad Respiratory system: Normal respiratory effort, no wheezing Cardiovascular system: regular rate, s1, s2 Gastrointestinal system: Soft, nondistended, positive BS Central nervous system: CN2-12 grossly intact, strength intact Extremities: Perfused, no clubbing Skin: Normal skin turgor, no notable skin lesions seen Psychiatry: Mood normal // no visual hallucinations   Data Reviewed:  Labs reviewed: Na 140, K 3.4, Cr 0.97  Family Communication: Pt in room, family not at bedside  Disposition: Status is: Inpatient Remains inpatient appropriate because: severity of illness  Planned Discharge Destination: Home    Author: Garnette Pelt, MD 04/08/2023 6:00 PM  For on call review [email or web address].

## 2023-04-08 NOTE — Consult Note (Signed)
 Reason for Consult:new onset congestive heart failure Referring Physician: [facility] hospitalist  Alyssa Gibson is an 83 y.o. female.  HPI: patient is 83 year old female with past medical history is significant for hypertension, COPD, remote tobacco abuse 30 pack years quit approximately 10 years ago, lung cancer, status post left upper lobectomy approximately 4 years ago, came to the ER yesterday because of progressive increasing shortness of breath for approximately 6-8 months and recently noted to have worsening dyspnea by climbing stairs at home so decided to come to the ED.  Patient denies any anginal chest pain but complains of vague localizes left infraclavicular pain.  Was seen by PMD last week and is scheduled for 2-D echo and nuclear stress test as outpatient.  Patient denies any PND but complains of orthopnea and leg swelling especially at night which gets resolved in the morning.  Denies palpitation lightheadedness or syncope.  States had stress test in the past which was normal records not available.  Patient states she follows up with cardiologist at [facility].  In ED patient was noted to be in mild congestive heart failure received IV Lasix  with improvement in her symptoms.  Patient states her husband recently passed away was suffering from liver cancer.  She was taking care of him and she ignored her health.  Past Medical History:  Diagnosis Date   Cancer (HCC)    Hypertension     Past Surgical History:  Procedure Laterality Date   CATARACT EXTRACTION     bilat   VAGINAL HYSTERECTOMY      Family History  Problem Relation Age of Onset   Colon cancer Neg Hx    Pancreatic cancer Neg Hx    Rectal cancer Neg Hx    Stomach cancer Neg Hx     Social History:  reports that she quit smoking about 10 years ago. Her smoking use included cigarettes. She has never used smokeless tobacco. She reports that she does not drink alcohol and does not use drugs.  Allergies:  Allergies   Allergen Reactions   Penicillins Hives    Medications: I have reviewed the patient's current medications.  Results for orders placed or performed during the hospital encounter of 04/07/23 (from the past 48 hours)  Brain natriuretic peptide     Status: Abnormal   Collection Time: 04/07/23  4:37 AM  Result Value Ref Range   B Natriuretic Peptide 457.7 (H) 0.0 - 100.0 pg/mL    Comment: Performed at [facility], [address] [place], [place] [address]  Troponin I (High Sensitivity)     Status: Abnormal   Collection Time: 04/07/23  4:37 AM  Result Value Ref Range   Troponin I (High Sensitivity) 41 (H) <18 ng/L    Comment: (NOTE) Elevated high sensitivity troponin I (hsTnI) values and significant  changes across serial measurements may suggest ACS but many other  chronic and acute conditions are known to elevate hsTnI results.  Refer to the Links section for chest pain algorithms and additional  guidance. Performed at [facility], [address] [place], [place] [address]   CBC     Status: None   Collection Time: 04/07/23  4:38 AM  Result Value Ref Range   WBC 10.3 4.0 - 10.5 K/uL    Comment: REPEATED TO VERIFY   RBC 4.87 3.87 - 5.11 MIL/uL   Hemoglobin 14.4 12.0 - 15.0 g/dL   HCT 56.2 63.9 - 53.9 %   MCV 89.7 80.0 -  100.0 fL   MCH 29.6 26.0 - 34.0 pg   MCHC 33.0 30.0 - 36.0 g/dL   RDW 84.5 88.4 - 84.4 %   Platelets 321 150 - 400 K/uL   nRBC 0.0 0.0 - 0.2 %    Comment: Performed at [facility], [address] [place], [place] [address]  Comprehensive metabolic panel     Status: Abnormal   Collection Time: 04/07/23  6:00 AM  Result Value Ref Range   Sodium 139 135 - 145 mmol/L   Potassium 4.0 3.5 - 5.1 mmol/L   Chloride 108 98 - 111 mmol/L   CO2 25 22 - 32 mmol/L   Glucose, Bld 115 (H) 70 - 99 mg/dL    Comment: Glucose reference range applies only to samples taken after fasting for at least 8 hours.   BUN 19 8 - 23 mg/dL    Creatinine, Ser 9.16 0.44 - 1.00 mg/dL   Calcium  9.0 8.9 - 10.3 mg/dL   Total Protein 6.7 6.5 - 8.1 g/dL   Albumin 3.6 3.5 - 5.0 g/dL   AST 20 15 - 41 U/L   ALT 15 0 - 44 U/L   Alkaline Phosphatase 54 38 - 126 U/L   Total Bilirubin 0.9 0.0 - 1.2 mg/dL   GFR, Estimated >39 >39 mL/min    Comment: (NOTE) Calculated using the CKD-EPI Creatinine Equation (2021)    Anion gap 6 5 - 15    Comment: Performed at [facility], [address] [place], [place] [address]  D-dimer, quantitative     Status: None   Collection Time: 04/07/23  6:00 AM  Result Value Ref Range   D-Dimer, Quant 0.40 0.00 - 0.50 ug/mL-FEU    Comment: (NOTE) At the manufacturer cut-off value of 0.5 g/mL FEU, this assay has a negative predictive value of 95-100%.This assay is intended for use in conjunction with a clinical pretest probability (PTP) assessment model to exclude pulmonary embolism (PE) and deep venous thrombosis (DVT) in outpatients suspected of PE or DVT. Results should be correlated with clinical presentation. Performed at [facility], [address] [place], [place] [address]   Troponin I (High Sensitivity)     Status: Abnormal   Collection Time: 04/07/23  6:41 AM  Result Value Ref Range   Troponin I (High Sensitivity) 76 (H) <18 ng/L    Comment: DELTA CHECK NOTED RESULT CALLED TO, READ BACK BY AND VERIFIED WITH ISABRA SABINA OAS RN [phone] 04/07/2023 T. TYSOR (NOTE) Elevated high sensitivity troponin I (hsTnI) values and significant  changes across serial measurements may suggest ACS but many other  chronic and acute conditions are known to elevate hsTnI results.  Refer to the Links section for chest pain algorithms and additional  guidance. Performed at [facility], [address] [place], [place] [address]   Basic metabolic panel     Status: Abnormal   Collection Time: 04/08/23  2:44 AM  Result Value Ref Range   Sodium 140 135 - 145 mmol/L    Potassium 3.4 (L) 3.5 - 5.1 mmol/L   Chloride 103 98 - 111 mmol/L   CO2 26 22 - 32 mmol/L   Glucose, Bld 110 (H) 70 - 99 mg/dL    Comment: Glucose reference range applies only to samples taken after fasting for at least 8 hours.   BUN 17 8 - 23 mg/dL   Creatinine, Ser 9.02 0.44 - 1.00 mg/dL   Calcium  8.8 (  L) 8.9 - 10.3 mg/dL   GFR, Estimated 58 (L) >60 mL/min    Comment: (NOTE) Calculated using the CKD-EPI Creatinine Equation (2021)    Anion gap 11 5 - 15    Comment: Performed at [facility], [address], [place], [place] [address]  TSH     Status: None   Collection Time: 04/08/23  2:44 AM  Result Value Ref Range   TSH 1.218 0.350 - 4.500 uIU/mL    Comment: Performed by a 3rd Generation assay with a functional sensitivity of <=0.01 uIU/mL. Performed at [facility], [address] [place], [place] [address]     DG Chest 2 View Result Date: 04/07/2023 CLINICAL DATA:  Shortness of breath. EXAM: CHEST - 2 VIEW COMPARISON:  08/09/2006 FINDINGS: Cardiopericardial silhouette is at upper limits of normal for size. Basilar predominant interstitial with some potential minimal airspace disease at the right base. No substantial pleural effusion. Bones are diffusely demineralized. Telemetry leads overlie the chest. IMPRESSION: Basilar predominant interstitial opacity bilaterally, right greater than left. Findings likely reflect chronic changes although a component of superimposed edema is not excluded. Subtle airspace disease in the extreme right lung base compatible with atelectasis or infection. Electronically Signed   By: Camellia Candle M.D.   On: 04/07/2023 05:12    Review of Systems  Constitutional:  Negative for chills and fever.  HENT:  Negative for sore throat.   Eyes:  Negative for discharge.  Respiratory:  Positive for shortness of breath. Negative for chest tightness and wheezing.   Cardiovascular:  Positive for leg swelling. Negative for chest pain and palpitations.   Gastrointestinal:  Negative for abdominal distention and abdominal pain.  Genitourinary:  Negative for difficulty urinating and dysuria.  Neurological:  Negative for dizziness and light-headedness.   Blood pressure 107/89, pulse 68, temperature 98.1 F (36.7 C), temperature source Oral, resp. rate 17, height 5' 5.5 (1.664 m), weight 55.7 kg, SpO2 100%. Physical Exam Constitutional:      General: She is not in acute distress.    Appearance: She is normal weight.  HENT:     Head: Normocephalic and atraumatic.  Eyes:     Pupils: Pupils are equal, round, and reactive to light.  Neck:     Vascular: No JVD.  Cardiovascular:     Rate and Rhythm: Tachycardia present.     Heart sounds: Murmur (2/6 systolic murmur and soft S3 gallop noted) heard.  Pulmonary:     Comments: Decreased breath sound at bases right more than left with occasional rhonchi Abdominal:     General: Bowel sounds are normal.     Palpations: Abdomen is soft. There is no hepatomegaly or mass.  Musculoskeletal:        General: Normal range of motion.     Cervical back: Normal range of motion and neck supple.     Right lower leg: No tenderness. No edema.     Left lower leg: No tenderness. No edema.  Skin:    General: Skin is warm and dry.  Neurological:     General: No focal deficit present.     Mental Status: She is alert and oriented to person, place, and time.     Assessment/Plan: Acute on chronic congestive heart failure probably secondary to preserved LV systolic function rule out coronary insufficiency Abnormal EKG Minimally elevated high-sensitivity troponin I secondary to above doubt significant MI in Hypertension hyperlipidemia History of carcinoma of the lung status post left upper lobectomy COPD  Remote history of tobacco abuse Plan Check serial enzymes and EKG check lipid panel DC amlodipine  Start metoprolol  succinate 25 mg daily Add spironolactone  25 mg half tablet daily Reduce furosemide  to 40  mg daily Check labs in a.m. Check 2-D echo Discussed with patient regarding nuclear stress test plan fully compensated states she is scheduled as outpatient in 1 week at [facility].  Levern Hutching 04/08/2023, 9:12 AM

## 2023-04-08 NOTE — Plan of Care (Signed)
   Problem: Health Behavior/Discharge Planning: Goal: Ability to manage health-related needs will improve Outcome: Progressing   Problem: Clinical Measurements: Goal: Ability to maintain clinical measurements within normal limits will improve Outcome: Progressing

## 2023-04-08 NOTE — Hospital Course (Signed)
 83 y.o. female with medical history significant of hypertension, COPD, lung cancer s/p lobectomy approximately 5 years ago w/o need of radiation or chemo, presented with shortness of breath, peripheral edema, and orthopnea.  She has been experiencing progressively worsening shortness of breath over the past year, which has made activities such as walking up steps difficult. Patient saw her primary last week who ordered outpatient echo and stress test.Last night, she experienced an acute episode of breathlessness, waking up unable to breathe, which led to her being brought to the hospital.  Approximately 4 months ago her husband passed away and she really had not been able to care for herself.   At [facility] O2 saturations noted to be as low as 90% for which patient was placed on supplemental oxygen, with improvement.  Labs significant for BNP 457.7,  HS-trop 41->76, D-dimer reassuring at 0.4. Chest x-ray significant for bibasilar predominant interstitial opacities bilaterally right greater than left thought to reflect edema, and subtle airspace disease in the extreme right lung base compatible with atelectasis or infection.  Patient was given albuterol  breathing treatment and Lasix  40 mg IV.  Orders placed for inpatient to a cardiac telemetry bed for treatment of presumed CHF exacerbation.

## 2023-04-08 NOTE — Evaluation (Signed)
 Physical Therapy Evaluation Patient Details Name: Alyssa Gibson MRN: [id number] DOB: [birth year] Today's Date: 04/08/2023  History of Present Illness  83 year old female admitted 2/6  because of progressive increasing shortness of breath for approximately 6-8 months and recently noted to have worsening dyspnea by climbing stairs at home. New onset CHF.  PMH: hypertension, COPD, remote tobacco abuse 30 pack years quit approximately 10 years ago, lung cancer, status post left upper lobectomy approximately 4 years ago  Clinical Impression  Pt admitted with above diagnosis. Pt was able to ambulate without device with occasional LOB with challenges which surprised pt somewhat.  Pt would benefit from use of rollator for support and HHPT f/u to work on balance as well. Pt agrees.  Pt did not desaturate on RA however did have DOE 2/4 therefore educated in incentive spirometer and pursed lip breathing. Will follow acutely. Pt currently with functional limitations due to the deficits listed below (see PT Problem List). Pt will benefit from acute skilled PT to increase their independence and safety with mobility to allow discharge.           If plan is discharge home, recommend the following: Assistance with cooking/housework;Help with stairs or ramp for entrance;Assist for transportation   Can travel by private vehicle        Equipment Recommendations Rollator (4 wheels)  Recommendations for Other Services       Functional Status Assessment Patient has had a recent decline in their functional status and demonstrates the ability to make significant improvements in function in a reasonable and predictable amount of time.     Precautions / Restrictions Precautions Precautions: Fall Restrictions Weight Bearing Restrictions Per Provider Order: No      Mobility  Bed Mobility Overal bed mobility: Independent                  Transfers Overall transfer level: Independent                       Ambulation/Gait Ambulation/Gait assistance: Contact guard assist Gait Distance (Feet): 300 Feet Assistive device: None, 1 person hand held assist Gait Pattern/deviations: Step-through pattern, Decreased stride length, Scissoring, Staggering left, Staggering right   Gait velocity interpretation: 1.31 - 2.62 ft/sec, indicative of limited community ambulator   General Gait Details: Pt overall with good balance however did have 2 episodes of LOB with turns and challenges and pt was able to self correct however pt surprised.  Discussed use of rollator at home due to pts breathing issues and slight imbalance and pt agrees.  Stairs            Wheelchair Mobility     Tilt Bed    Modified Rankin (Stroke Patients Only)       Balance Overall balance assessment: Needs assistance Sitting-balance support: No upper extremity supported, Feet supported Sitting balance-Leahy Scale: Fair     Standing balance support: Single extremity supported, No upper extremity supported, During functional activity Standing balance-Leahy Scale: Fair               High level balance activites: Direction changes, Sudden stops, Turns High Level Balance Comments: CGA for challenges to balance             Pertinent Vitals/Pain Pain Assessment Pain Assessment: No/denies pain    Home Living Family/patient expects to be discharged to:: Private residence Living Arrangements: Alone Available Help at Discharge: Family;Neighbor;Available PRN/intermittently (daughter and neighbor work) Type of Home: House Home Access: Stairs to  enter Entrance Stairs-Rails: None Entrance Stairs-Number of Steps: 2 Alternate Level Stairs-Number of Steps: 13 Home Layout: Two level;Bed/bath upstairs;1/2 bath on main level Home Equipment: None Additional Comments: 2 dogs    Prior Function Prior Level of Function : Independent/Modified Independent;Driving;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)              Mobility Comments: No device used ADLs Comments: No assist needed     Extremity/Trunk Assessment   Upper Extremity Assessment Upper Extremity Assessment: Defer to OT evaluation    Lower Extremity Assessment Lower Extremity Assessment: Overall WFL for tasks assessed    Cervical / Trunk Assessment Cervical / Trunk Assessment: Normal  Communication   Communication Communication: No apparent difficulties  Cognition Arousal: Alert Behavior During Therapy: WFL for tasks assessed/performed Overall Cognitive Status: Within Functional Limits for tasks assessed                                          General Comments General comments (skin integrity, edema, etc.): 105 bpm, 97%RA at rest.  Pt sats >93% on rA with activity as well.  DOE 2/4 with ambulation therefore encouraged pursed lip breathing and obtained incentive spirometer for pt.    Exercises     Assessment/Plan    PT Assessment Patient needs continued PT services  PT Problem List Decreased activity tolerance;Decreased balance;Decreased mobility;Decreased knowledge of use of DME;Decreased safety awareness;Decreased knowledge of precautions;Cardiopulmonary status limiting activity       PT Treatment Interventions DME instruction;Gait training;Functional mobility training;Therapeutic activities;Therapeutic exercise;Balance training;Stair training;Patient/family education    PT Goals (Current goals can be found in the Care Plan section)  Acute Rehab PT Goals Patient Stated Goal: to go home PT Goal Formulation: With patient Time For Goal Achievement: 04/22/23 Potential to Achieve Goals: Good    Frequency Min 1X/week     Co-evaluation               AM-PAC PT 6 Clicks Mobility  Outcome Measure Help needed turning from your back to your side while in a flat bed without using bedrails?: None Help needed moving from lying on your back to sitting on the side of a flat bed without  using bedrails?: None Help needed moving to and from a bed to a chair (including a wheelchair)?: A Little Help needed standing up from a chair using your arms (e.g., wheelchair or bedside chair)?: None Help needed to walk in hospital room?: A Little Help needed climbing 3-5 steps with a railing? : A Lot 6 Click Score: 20    End of Session Equipment Utilized During Treatment: Gait belt Activity Tolerance: Patient limited by fatigue Patient left: in bed;with call bell/phone within reach;with bed alarm set Nurse Communication: Mobility status PT Visit Diagnosis: Unsteadiness on feet (R26.81);Muscle weakness (generalized) (M62.81)    Time: 1031-1050 PT Time Calculation (min) (ACUTE ONLY): 19 min   Charges:   PT Evaluation $PT Eval Moderate Complexity: 1 Mod   PT General Charges $$ ACUTE PT VISIT: 1 Visit         Tranisha Tissue M,PT Acute Rehab Services [phone]   Stephane JULIANNA Bevel 04/08/2023, 11:54 AM

## 2023-04-08 NOTE — Progress Notes (Signed)
 Echocardiogram 2D Echocardiogram has been performed.  Alyssa Gibson 04/08/2023, 9:52 AM

## 2023-04-08 NOTE — Progress Notes (Signed)
   04/08/23 1344  Spiritual Encounters  Type of Visit Initial  Care provided to: Patient  Reason for visit Routine spiritual support   Chaplain Assessment  Chaplain's Reason for Visit: Chaplain visited Pt in response to Spiritual Care Consult of Pt seeking prayer and support in a major health transition.  Chaplain time spent: 30 minutes  Chaplain Interventions: Established relationship of care and support Explored Pt spiritual, emotional, and relational needs and resources Facilitated storytelling Listened empathetically as Pt emotionally/tearfully processed current crisis Provided grief support and education  Chaplain Assessment: Pt Needs Spiritual: Pt is seeking comfort from her higher power in the midst of her grief Emotional: Pt in a place of overwhelm as she wrestles with grief and a new, concerning health diagnosis at the same time Relational: Pt lost spouse of 35 years a few months ago.  Intermediate hopes: To figure out how to get through this  Ultimate hopes: Pt states she wants."To be with my husband again in heaven" Pt Resources  Resources Identified:  Pt possesses personal a personal faith that she seems to be able to use to encourage and support herself with (Needs Support, Supported, Well Supported & Strong) Spiritual: Supported Emotional: Supported Relational: Needs Support  Additional Assessment: Chaplain offered the opportunity for Pt to share he "grief story" about how she lost her husband, which is part of the greater healing process. She took her husband to see his brother in [place] the weekend before the husband died.  Both husband and brother died in the same week.  This week is particularly difficult week for Pt as it is the week of her birthday and the week before valentine's day.  These were big occasions for her and her husband.  Also, they typically went on a cruise this week every year with friends- those friends are on that cruise now.  These are triggers  that are amplifying her grief this week.  Pt is using her faith to help regulate her emotions during her time in the hospital.  She believes that if she survives this event, then she wins, but if she doesn't, she still wins because she will be with her husband.  Chaplaincy Plan: Pt seems to have good self-soothing skills, an active and helpful faith, and a circle of support around her.  Chaplain does not plan to follow up, but will return if Pt seeks our support.  Chaplain Maude Roll, MDiv [email or web address] [email or web address]

## 2023-04-09 DIAGNOSIS — I509 Heart failure, unspecified: Secondary | ICD-10-CM | POA: Diagnosis not present

## 2023-04-09 DIAGNOSIS — J9601 Acute respiratory failure with hypoxia: Secondary | ICD-10-CM | POA: Diagnosis not present

## 2023-04-09 LAB — BASIC METABOLIC PANEL
Anion gap: 10 (ref 5–15)
BUN: 17 mg/dL (ref 8–23)
CO2: 23 mmol/L (ref 22–32)
Calcium: 9 mg/dL (ref 8.9–10.3)
Chloride: 106 mmol/L (ref 98–111)
Creatinine, Ser: 0.84 mg/dL (ref 0.44–1.00)
GFR, Estimated: >60 mL/min (ref >60)
Glucose, Bld: 110 mg/dL — ABNORMAL HIGH (ref 70–99)
Potassium: 4.3 mmol/L (ref 3.5–5.1)
Sodium: 139 mmol/L (ref 135–145)

## 2023-04-09 LAB — CBC
HCT: 42.8 % (ref 36.0–46.0)
Hemoglobin: 13.9 g/dL (ref 12.0–15.0)
MCH: 29.6 pg (ref 26.0–34.0)
MCHC: 32.5 g/dL (ref 30.0–36.0)
MCV: 91.1 fL (ref 80.0–100.0)
Platelets: 297 10*3/uL (ref 150–400)
RBC: 4.7 MIL/uL (ref 3.87–5.11)
RDW: 15.2 % (ref 11.5–15.5)
WBC: 6.7 10*3/uL (ref 4.0–10.5)
nRBC: 0 % (ref 0.0–0.2)

## 2023-04-09 LAB — TROPONIN I (HIGH SENSITIVITY): Troponin I (High Sensitivity): 19 ng/L — ABNORMAL HIGH (ref <18)

## 2023-04-09 LAB — LIPID PANEL
Cholesterol: 159 mg/dL (ref 0–200)
HDL: 47 mg/dL (ref >40)
LDL Cholesterol: 97 mg/dL (ref 0–99)
Total CHOL/HDL Ratio: 3.4 {ratio}
Triglycerides: 77 mg/dL (ref <150)
VLDL: 15 mg/dL (ref 0–40)

## 2023-04-09 LAB — BRAIN NATRIURETIC PEPTIDE: B Natriuretic Peptide: 219.1 pg/mL — ABNORMAL HIGH (ref 0.0–100.0)

## 2023-04-09 MED ORDER — LOSARTAN POTASSIUM 25 MG PO TABS
25.0000 mg | ORAL_TABLET | Freq: Every day | ORAL | Status: DC
Start: 2023-04-09 — End: 2023-04-10
  Administered 2023-04-09 – 2023-04-10 (×2): 25 mg via ORAL
  Filled 2023-04-09 (×2): qty 1

## 2023-04-09 MED ORDER — ATORVASTATIN CALCIUM 40 MG PO TABS
40.0000 mg | ORAL_TABLET | Freq: Every day | ORAL | Status: DC
  Administered 2023-04-09 – 2023-04-10 (×2): 40 mg via ORAL
  Filled 2023-04-09 (×2): qty 1

## 2023-04-09 MED ORDER — DAPAGLIFLOZIN PROPANEDIOL 5 MG PO TABS
5.0000 mg | ORAL_TABLET | Freq: Every day | ORAL | Status: DC
Start: 2023-04-09 — End: 2023-04-10
  Administered 2023-04-09 – 2023-04-10 (×2): 5 mg via ORAL
  Filled 2023-04-09 (×2): qty 1

## 2023-04-09 NOTE — Progress Notes (Signed)
  Progress Note   Patient: Alyssa Gibson FMW:[id number] DOB: [birth year] DOA: 04/07/2023     2 DOS: the patient was seen and examined on 04/09/2023   Brief hospital course: 83 y.o. female with medical history significant of hypertension, COPD, lung cancer s/p lobectomy approximately 5 years ago w/o need of radiation or chemo, presented with shortness of breath, peripheral edema, and orthopnea.  She has been experiencing progressively worsening shortness of breath over the past year, which has made activities such as walking up steps difficult. Patient saw her primary last week who ordered outpatient echo and stress test.Last night, she experienced an acute episode of breathlessness, waking up unable to breathe, which led to her being brought to the hospital.  Approximately 4 months ago her husband passed away and she really had not been able to care for herself.   At [facility] O2 saturations noted to be as low as 90% for which patient was placed on supplemental oxygen, with improvement.  Labs significant for BNP 457.7,  HS-trop 41->76, D-dimer reassuring at 0.4. Chest x-ray significant for bibasilar predominant interstitial opacities bilaterally right greater than left thought to reflect edema, and subtle airspace disease in the extreme right lung base compatible with atelectasis or infection.  Patient was given albuterol  breathing treatment and Lasix  40 mg IV.  Orders placed for inpatient to a cardiac telemetry bed for treatment of presumed CHF exacerbation.   Assessment and Plan: Acute respiratory failure with hypoxia secondary to new onset congestive heart failure -Presented with evidence of acute heart failure and vol overload. -stopped IV lasix  daily 40mg  -Cardiology following. Recs to start metoprolol  12.5mg  metoprolol , add spironolactone  12.5mg  qday -Losartan  and farxiga  started today per Cardiology -2d echo reviewed. 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   Insomnia -Continue trazodone          Subjective: Without complaints this AM  Physical Exam: Vitals:   04/09/23 0039 04/09/23 0456 04/09/23 0832 04/09/23 1638  BP: 112/65 119/68 (!) 114/53 105/64  Pulse: 94 87 92 90  Resp:   20 20  Temp: 98.1 F (36.7 C) 98.1 F (36.7 C) 98 F (36.7 C) 98.2 F (36.8 C)  TempSrc: Oral Oral Oral Oral  SpO2: 95% 99% 100% 100%  Weight:  56.2 kg    Height:       General exam: Conversant, in no acute distress Respiratory system: normal chest rise, clear, no audible wheezing Cardiovascular system: regular rhythm, s1-s2 Gastrointestinal system: Nondistended, nontender, pos BS Central nervous system: No seizures, no tremors Extremities: No cyanosis, no joint deformities Skin: No rashes, no pallor Psychiatry: Affect normal // no auditory hallucinations   Data Reviewed:  Labs reviewed: Na 139, k 4.3, Cr 0.84, WBC 6.7, Hgb 13.9  Family Communication: Pt in room, family not at bedside  Disposition: Status is: Inpatient Remains inpatient appropriate because: severity of illness  Planned Discharge Destination: Home    Author: Garnette Pelt, MD 04/09/2023 6:40 PM  For on call review [email or web address].

## 2023-04-09 NOTE — Progress Notes (Signed)
 Mobility Specialist Progress Note:   04/09/23 1500  Mobility  Activity Ambulated with assistance in hallway  Level of Assistance Standby assist, set-up cues, supervision of patient - no hands on  Assistive Device None  Distance Ambulated (ft) 350 ft  Activity Response Tolerated well  Mobility Referral Yes  Mobility visit 1 Mobility  Mobility Specialist Start Time (ACUTE ONLY) 1500  Mobility Specialist Stop Time (ACUTE ONLY) 1515  Mobility Specialist Time Calculation (min) (ACUTE ONLY) 15 min   Pt agreeable to mobility session. Required only supervision for safety. VSS on RA. SpO2 100%. Pt back in bed with all needs met.   Therisa Rana Mobility Specialist Please contact via SecureChat or  Rehab office at [phone]

## 2023-04-09 NOTE — Progress Notes (Signed)
 Subjective:  Patient denies any chest pain or shortness of breath states overall feels much better 2D echo results noted  Objective:  Vital Signs in the last 24 hours: Temp:  [97.9 F (36.6 C)-98.1 F (36.7 C)] 98 F (36.7 C) (02/08 0832) Pulse Rate:  [60-106] 92 (02/08 0832) Resp:  [18-20] 20 (02/08 0832) BP: (105-119)/(53-69) 114/53 (02/08 0832) SpO2:  [88 %-100 %] 100 % (02/08 0832) Weight:  [56.2 kg] 56.2 kg (02/08 0456)  Intake/Output from previous day: 02/07 0701 - 02/08 0700 In: 956 [P.O.:956] Out: 750 [Urine:750] Intake/Output from this shift: No intake/output data recorded.  Physical Exam: Neck: no adenopathy, no carotid bruit, no JVD, and supple, symmetrical, trachea midline Lungs: Decreased breath sound at bases right more than left air entry has improved Heart: regular rate and rhythm, S1, S2 normal, and soft systolic murmur noted Abdomen: soft, non-tender; bowel sounds normal; no masses,  no organomegaly Extremities: extremities normal, atraumatic, no cyanosis or edema  Lab Results: Recent Labs    04/07/23 0438 04/09/23 0229  WBC 10.3 6.7  HGB 14.4 13.9  PLT 321 297   Recent Labs    04/08/23 0244 04/09/23 0229  NA 140 139  K 3.4* 4.3  CL 103 106  CO2 26 23  GLUCOSE 110* 110*  BUN 17 17  CREATININE 0.97 0.84   No results for input(s): TROPONINI in the last 72 hours.  Invalid input(s): CK, MB Hepatic Function Panel Recent Labs    04/07/23 0600  PROT 6.7  ALBUMIN 3.6  AST 20  ALT 15  ALKPHOS 54  BILITOT 0.9   Recent Labs    04/09/23 0229  CHOL 159   No results for input(s): PROTIME in the last 72 hours.  Imaging: Imaging results have been reviewed and ECHOCARDIOGRAM COMPLETE Result Date: 04/08/2023    ECHOCARDIOGRAM REPORT   Patient Name:   [facility] DAVIS-SULLIVAN Date of Exam: 04/08/2023 Medical Rec #:  [id number]           Height:       65.5 in Accession #:    [id number]          Weight:       122.8 lb Date of Birth:  09/20/1940             BSA:          1.617 m Patient Age:    83 years            BP:           104/59 mmHg Patient Gender: F                   HR:           104 bpm. Exam Location:  Inpatient Procedure: 2D Echo, Cardiac Doppler and Color Doppler Indications:    CHF- Acute Diastolic  History:        Patient has no prior history of Echocardiogram examinations.                 COPD; Risk Factors:Hypertension and Former Smoker.  Sonographer:    Ozell Free Referring Phys: [phone] RONDELL A SMITH IMPRESSIONS  1. Left ventricular ejection fraction, by estimation, is 20 to 25%. The left ventricle has severely decreased function. The left ventricle demonstrates global hypokinesis. The left ventricular internal cavity size was mildly dilated. There is mild concentric left ventricular hypertrophy. Left ventricular diastolic parameters are indeterminate.  2. Right ventricular systolic function is normal. The right ventricular  size is normal. There is normal pulmonary artery systolic pressure.  3. Left atrial size was moderately dilated.  4. A small pericardial effusion is present. The pericardial effusion is anterior to the right ventricle.  5. The mitral valve is normal in structure. Trivial mitral valve regurgitation. No evidence of mitral stenosis.  6. The aortic valve is tricuspid. There is mild calcification of the aortic valve. Aortic valve regurgitation is mild. Aortic valve sclerosis/calcification is present, without any evidence of aortic stenosis.  7. The inferior vena cava is normal in size with greater than 50% respiratory variability, suggesting right atrial pressure of 3 mmHg. FINDINGS  Left Ventricle: Left ventricular ejection fraction, by estimation, is 20 to 25%. The left ventricle has severely decreased function. The left ventricle demonstrates global hypokinesis. The left ventricular internal cavity size was mildly dilated. There is mild concentric left ventricular hypertrophy. Left ventricular diastolic parameters are  indeterminate. Right Ventricle: The right ventricular size is normal. No increase in right ventricular wall thickness. Right ventricular systolic function is normal. There is normal pulmonary artery systolic pressure. The tricuspid regurgitant velocity is 2.35 m/s, and  with an assumed right atrial pressure of 3 mmHg, the estimated right ventricular systolic pressure is 25.1 mmHg. Left [facility]: Left atrial size was moderately dilated. Right [facility]: Right atrial size was normal in size. Pericardium: A small pericardial effusion is present. The pericardial effusion is anterior to the right ventricle. Mitral Valve: The mitral valve is normal in structure. Trivial mitral valve regurgitation. No evidence of mitral valve stenosis. Tricuspid Valve: The tricuspid valve is normal in structure. Tricuspid valve regurgitation is trivial. No evidence of tricuspid stenosis. Aortic Valve: The aortic valve is tricuspid. There is mild calcification of the aortic valve. Aortic valve regurgitation is mild. Aortic regurgitation PHT measures 530 msec. Aortic valve sclerosis/calcification is present, without any evidence of aortic stenosis. Aortic valve mean gradient measures 4.0 mmHg. Aortic valve peak gradient measures 6.6 mmHg. Aortic valve area, by VTI measures 1.40 cm. Pulmonic Valve: The pulmonic valve was normal in structure. Pulmonic valve regurgitation is not visualized. No evidence of pulmonic stenosis. Aorta: The aortic root is normal in size and structure. Venous: The inferior vena cava is normal in size with greater than 50% respiratory variability, suggesting right atrial pressure of 3 mmHg. IAS/Shunts: No atrial level shunt detected by color flow Doppler.  LEFT VENTRICLE PLAX 2D LVIDd:         4.20 cm     Diastology LVIDs:         4.00 cm     LV e' medial:    5.44 cm/s LV PW:         1.10 cm     LV E/e' medial:  19.3 LV IVS:        1.00 cm     LV e' lateral:   6.20 cm/s LVOT diam:     2.10 cm     LV E/e' lateral: 16.9 LV  SV:         29 LV SV Index:   18 LVOT Area:     3.46 cm  LV Volumes (MOD) LV vol d, MOD A2C: 80.7 ml LV vol d, MOD A4C: 99.7 ml LV vol s, MOD A2C: 53.9 ml LV vol s, MOD A4C: 57.8 ml LV SV MOD A2C:     26.8 ml LV SV MOD A4C:     99.7 ml LV SV MOD BP:      34.5 ml RIGHT VENTRICLE  IVC RV Basal diam:  2.70 cm     IVC diam: 1.50 cm RV S prime:     15.30 cm/s TAPSE (M-mode): 1.9 cm LEFT ATRIUM             Index        RIGHT ATRIUM           Index LA diam:        4.10 cm 2.54 cm/m   RA Area:     13.30 cm LA Vol (A2C):   44.8 ml 27.71 ml/m  RA Volume:   31.90 ml  19.73 ml/m LA Vol (A4C):   70.5 ml 43.60 ml/m LA Biplane Vol: 56.9 ml 35.19 ml/m  AORTIC VALVE AV Area (Vmax):    1.42 cm AV Area (Vmean):   1.30 cm AV Area (VTI):     1.40 cm AV Vmax:           128.00 cm/s AV Vmean:          89.200 cm/s AV VTI:            0.207 m AV Peak Grad:      6.6 mmHg AV Mean Grad:      4.0 mmHg LVOT Vmax:         52.30 cm/s LVOT Vmean:        33.400 cm/s LVOT VTI:          0.084 m LVOT/AV VTI ratio: 0.40 AI PHT:            530 msec  AORTA Ao Root diam: 2.90 cm MITRAL VALVE                TRICUSPID VALVE MV Area (PHT): 10.00 cm    TR Peak grad:   22.1 mmHg MV E velocity: 105.00 cm/s  TR Vmax:        235.00 cm/s MV A velocity: 55.70 cm/s MV E/A ratio:  1.89         SHUNTS                             Systemic VTI:  0.08 m                             Systemic Diam: 2.10 cm Toribio Fuel MD Electronically signed by Toribio Fuel MD Signature Date/Time: 04/08/2023/9:58:35 AM    Final     Cardiac Studies:  Assessment/Plan:  Resolving acute systolic congestive heart failure Minimally elevated high-sensitivity troponin I secondary to above doubt significant MI Hypertension Hyperlipidemia History of adenocarcinoma of the left lung status post left lobectomy COPD Remote history of tobacco abuse Plan DC Lasix  after today's dose Start losartan  25 mg daily Start Farxiga  5 mg daily Restart atorvastatin  Will  uptitrate beta-blockers as blood pressure tolerates and when fully compensated Discussed with patient at length regarding various options of treatment states has appointment to see cardiologist at [facility] and will be scheduled for nuclear stress test as outpatient   LOS: 2 days    Levern Hutching 04/09/2023, 9:43 AM

## 2023-04-09 NOTE — Plan of Care (Signed)

## 2023-04-10 DIAGNOSIS — J9601 Acute respiratory failure with hypoxia: Secondary | ICD-10-CM | POA: Diagnosis not present

## 2023-04-10 DIAGNOSIS — I509 Heart failure, unspecified: Secondary | ICD-10-CM | POA: Diagnosis not present

## 2023-04-10 LAB — COMPREHENSIVE METABOLIC PANEL
ALT: 12 U/L (ref 0–44)
AST: 16 U/L (ref 15–41)
Albumin: 3.2 g/dL — ABNORMAL LOW (ref 3.5–5.0)
Alkaline Phosphatase: 53 U/L (ref 38–126)
Anion gap: 11 (ref 5–15)
BUN: 18 mg/dL (ref 8–23)
CO2: 23 mmol/L (ref 22–32)
Calcium: 9.1 mg/dL (ref 8.9–10.3)
Chloride: 103 mmol/L (ref 98–111)
Creatinine, Ser: 0.94 mg/dL (ref 0.44–1.00)
GFR, Estimated: >60 mL/min (ref >60)
Glucose, Bld: 115 mg/dL — ABNORMAL HIGH (ref 70–99)
Potassium: 4 mmol/L (ref 3.5–5.1)
Sodium: 137 mmol/L (ref 135–145)
Total Bilirubin: 0.7 mg/dL (ref 0.0–1.2)
Total Protein: 6 g/dL — ABNORMAL LOW (ref 6.5–8.1)

## 2023-04-10 MED ORDER — SPIRONOLACTONE 25 MG PO TABS: 12.5000 mg | ORAL_TABLET | Freq: Every day | ORAL | 0 refills | Status: AC

## 2023-04-10 MED ORDER — LOSARTAN POTASSIUM 25 MG PO TABS: 25.0000 mg | ORAL_TABLET | Freq: Every day | ORAL | 0 refills | Status: AC

## 2023-04-10 MED ORDER — METOPROLOL SUCCINATE ER 25 MG PO TB24: 25.0000 mg | ORAL_TABLET | Freq: Every day | ORAL | 0 refills | Status: AC

## 2023-04-10 MED ORDER — ATORVASTATIN CALCIUM 40 MG PO TABS: 40.0000 mg | ORAL_TABLET | Freq: Every day | ORAL | 0 refills | Status: AC

## 2023-04-10 MED ORDER — DAPAGLIFLOZIN PROPANEDIOL 5 MG PO TABS: 5.0000 mg | ORAL_TABLET | Freq: Every day | ORAL | 0 refills | Status: AC

## 2023-04-10 NOTE — Progress Notes (Signed)
 Subjective:  Patient denies any chest pain or shortness of breath denies any leg swelling states overall feels well.  Objective:  Vital Signs in the last 24 hours: Temp:  [97.9 F (36.6 C)-98.2 F (36.8 C)] 98.2 F (36.8 C) (02/09 0807) Pulse Rate:  [86-90] 87 (02/09 0807) Resp:  [18-20] 18 (02/09 0807) BP: (95-113)/(56-75) 103/60 (02/09 0807) SpO2:  [96 %-100 %] 99 % (02/09 0807) Weight:  [55.4 kg] 55.4 kg (02/09 0436)  Intake/Output from previous day: 02/08 0701 - 02/09 0700 In: 120 [P.O.:120] Out: 1200 [Urine:1200] Intake/Output from this shift: Total I/O In: 103 [P.O.:100; I.V.:3] Out: -   Physical Exam: Neck: no adenopathy, no carotid bruit, no JVD, and supple, symmetrical, trachea midline Lungs: clear to auscultation bilaterally Heart: regular rate and rhythm, S1, S2 normal, and soft systolic murmur noted no S3 gallop Abdomen: soft, non-tender; bowel sounds normal; no masses,  no organomegaly Extremities: extremities normal, atraumatic, no cyanosis or edema  Lab Results: Recent Labs    04/09/23 0229  WBC 6.7  HGB 13.9  PLT 297   Recent Labs    04/09/23 0229 04/10/23 0256  NA 139 137  K 4.3 4.0  CL 106 103  CO2 23 23  GLUCOSE 110* 115*  BUN 17 18  CREATININE 0.84 0.94   No results for input(s): TROPONINI in the last 72 hours.  Invalid input(s): CK, MB Hepatic Function Panel Recent Labs    04/10/23 0256  PROT 6.0*  ALBUMIN 3.2*  AST 16  ALT 12  ALKPHOS 53  BILITOT 0.7   Recent Labs    04/09/23 0229  CHOL 159   No results for input(s): PROTIME in the last 72 hours.  Imaging: Imaging results have been reviewed and No results found.  Cardiac Studies:  Assessment/Plan:  Compensated systolic congestive heart failure Minimally elevated high-sensitivity troponin I secondary to above doubt significant MI Hypertension Hyperlipidemia History of adenocarcinoma of the left lung status post left lobectomy COPD Remote history of  tobacco abuse Plan Continue present management Okay to discharge from cardiac point of view Continue cardiac meds i.e. metoprolol  succinate losartan  Aldactone , Farxiga  and atorvastatin  Heart failure instructions have been given Patient will follow-up with her cardiologist at [facility] I will sign off please call if needed   LOS: 3 days    Alyssa Gibson 04/10/2023, 10:58 AM

## 2023-04-10 NOTE — Plan of Care (Signed)

## 2023-04-10 NOTE — Discharge Summary (Signed)
 Physician Discharge Summary   Patient: Alyssa Gibson MRN: [id number] DOB: May 17, 1940  Admit date:     04/07/2023  Discharge date: 04/10/23  Discharge Physician: Garnette Pelt   PCP: Alyssa Allean BIRCH, MD   Recommendations at discharge:    Follow up with PCP in 1-2 weeks Follow up with Cardiology as scheduled  Discharge Diagnoses: Principal Problem:   New onset of congestive heart failure (HCC) Active Problems:   Acute respiratory failure with hypoxia (HCC)   Essential hypertension   COPD (chronic obstructive pulmonary disease) (HCC)   History of lung cancer   Insomnia  Resolved Problems:   * No resolved hospital problems. *  Hospital Course: 83 y.o. female with medical history significant of hypertension, COPD, lung cancer s/p lobectomy approximately 5 years ago w/o need of radiation or chemo, presented with shortness of breath, peripheral edema, and orthopnea.  She has been experiencing progressively worsening shortness of breath over the past year, which has made activities such as walking up steps difficult. Patient saw her primary last week who ordered outpatient echo and stress test.Last night, she experienced an acute episode of breathlessness, waking up unable to breathe, which led to her being brought to the hospital.  Approximately 4 months ago her husband passed away and she really had not been able to care for herself.   At [facility] O2 saturations noted to be as low as 90% for which patient was placed on supplemental oxygen, with improvement.  Labs significant for BNP 457.7,  HS-trop 41->76, D-dimer reassuring at 0.4. Chest x-ray significant for bibasilar predominant interstitial opacities bilaterally right greater than left thought to reflect edema, and subtle airspace disease in the extreme right lung base compatible with atelectasis or infection.  Patient was given albuterol  breathing treatment and Lasix  40 mg IV.  Orders placed for inpatient to a cardiac telemetry bed  for treatment of presumed CHF exacerbation.   Assessment and Plan: Acute respiratory failure with hypoxia secondary to new onset congestive heart failure -Presented with evidence of acute heart failure and vol overload. -stopped IV lasix  daily 40mg  -Cardiology following. Recs to start metoprolol  12.5mg  metoprolol , add spironolactone  12.5mg  qday -Losartan  and farxiga  started today per Cardiology -2d echo reviewed. 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   Insomnia -Continue trazodone        Consultants: Cardiology Procedures performed:   Disposition: Home Diet recommendation:  Cardiac diet DISCHARGE MEDICATION: Allergies as of 04/10/2023       Reactions   Penicillins Hives        Medication List     STOP taking these medications    amLODipine  10 MG tablet Commonly known as: NORVASC        TAKE these medications    atorvastatin  40 MG tablet Commonly known as: LIPITOR Take 1 tablet (40 mg total) by mouth daily. Start taking on: April 11, 2023   cholecalciferol 10 MCG (400 UNIT) Tabs tablet Commonly known as: VITAMIN D3 Take 400 Units by mouth daily.   cyanocobalamin 1000 MCG tablet Commonly known as: VITAMIN B12 Take 1,000 mcg by mouth daily.   cyclobenzaprine  10 MG tablet Commonly known as: FLEXERIL  Take 1 tablet (10 mg total) by mouth 2 (two) times  daily as needed for muscle spasms.   dapagliflozin  propanediol 5 MG Tabs tablet Commonly known as: FARXIGA  Take 1 tablet (5 mg total) by mouth daily. Start taking on: April 11, 2023   fish oil-omega-3 fatty acids 1000 MG capsule Take 2 g by mouth daily.   losartan  25 MG  tablet Commonly known as: COZAAR  Take 1 tablet (25 mg total) by mouth daily. Start taking on: April 11, 2023   metoprolol  succinate 25 MG 24 hr tablet Commonly known as: TOPROL -XL Take 1 tablet (25 mg total) by mouth daily. Start taking on: April 11, 2023   spironolactone  25 MG tablet Commonly known as: ALDACTONE  Take 0.5 tablets (12.5 mg total) by mouth daily. Start taking on: April 11, 2023   timolol  0.5 % ophthalmic solution Commonly known as: TIMOPTIC  Place 1 drop into both eyes daily.   traZODone  100 MG tablet Commonly known as: DESYREL  Take 100 mg by mouth at bedtime.        Follow-up Information     [facility] Follow up.   Why: Agency will call you to set up apt times Contact information: [address] [phone]        [facility] Follow up.   Why: [facility] Contact information: [address] [place] [address] [phone]         Alyssa Allean BIRCH, MD Follow up in 2 week(s).   Why: Hospital follow up Contact information: [address] [facility] [address] [phone]                Discharge Exam: Filed Weights   04/08/23 0448 04/09/23 0456 04/10/23 0436  Weight: 55.7 kg 56.2 kg 55.4 kg   General exam: Awake, laying in bed, in nad Respiratory system: Normal respiratory effort, no wheezing Cardiovascular system: regular rate, s1, s2 Gastrointestinal system: Soft, nondistended, positive BS Central nervous system: CN2-12 grossly intact, strength intact Extremities: Perfused, no clubbing Skin: Normal skin turgor, no notable skin lesions seen Psychiatry: Mood normal // no visual hallucinations   Condition at discharge: fair  The results of significant diagnostics from this hospitalization (including imaging, microbiology, ancillary and laboratory) are listed below for reference.   Imaging Studies: ECHOCARDIOGRAM COMPLETE Result Date: 04/08/2023     ECHOCARDIOGRAM REPORT   Patient Name:   Alyssa Gibson Date of Exam: 04/08/2023 Medical Rec #:  [id number]           Height:       65.5 in Accession #:    [id number]          Weight:       122.8 lb Date of Birth:  December 28, 1940            BSA:          1.617 m Patient Age:    83 years            BP:           104/59 mmHg Patient Gender: F                   HR:           104 bpm. Exam Location:  Inpatient Procedure: 2D Echo, Cardiac Doppler and Color Doppler Indications:    CHF- Acute Diastolic  History:        Patient has no prior history of Echocardiogram examinations.  COPD; Risk Factors:Hypertension and Former Smoker.  Sonographer:    Ozell Free Referring Phys: [phone] RONDELL A SMITH IMPRESSIONS  1. Left ventricular ejection fraction, by estimation, is 20 to 25%. The left ventricle has severely decreased function. The left ventricle demonstrates global hypokinesis. The left ventricular internal cavity size was mildly dilated. There is mild concentric left ventricular hypertrophy. Left ventricular diastolic parameters are indeterminate.  2. Right ventricular systolic function is normal. The right ventricular size is normal. There is normal pulmonary artery systolic pressure.  3. Left atrial size was moderately dilated.  4. A small pericardial effusion is present. The pericardial effusion is anterior to the right ventricle.  5. The mitral valve is normal in structure. Trivial mitral valve regurgitation. No evidence of mitral stenosis.  6. The aortic valve is tricuspid. There is mild calcification of the aortic valve. Aortic valve regurgitation is mild. Aortic valve sclerosis/calcification is present, without any evidence of aortic stenosis.  7. The inferior vena cava is normal in size with greater than 50% respiratory variability, suggesting right atrial pressure of 3 mmHg. FINDINGS  Left Ventricle: Left ventricular ejection fraction, by estimation, is 20 to 25%. The left ventricle has severely  decreased function. The left ventricle demonstrates global hypokinesis. The left ventricular internal cavity size was mildly dilated. There is mild concentric left ventricular hypertrophy. Left ventricular diastolic parameters are indeterminate. Right Ventricle: The right ventricular size is normal. No increase in right ventricular wall thickness. Right ventricular systolic function is normal. There is normal pulmonary artery systolic pressure. The tricuspid regurgitant velocity is 2.35 m/s, and  with an assumed right atrial pressure of 3 mmHg, the estimated right ventricular systolic pressure is 25.1 mmHg. Left [facility]: Left atrial size was moderately dilated. Right [facility]: Right atrial size was normal in size. Pericardium: A small pericardial effusion is present. The pericardial effusion is anterior to the right ventricle. Mitral Valve: The mitral valve is normal in structure. Trivial mitral valve regurgitation. No evidence of mitral valve stenosis. Tricuspid Valve: The tricuspid valve is normal in structure. Tricuspid valve regurgitation is trivial. No evidence of tricuspid stenosis. Aortic Valve: The aortic valve is tricuspid. There is mild calcification of the aortic valve. Aortic valve regurgitation is mild. Aortic regurgitation PHT measures 530 msec. Aortic valve sclerosis/calcification is present, without any evidence of aortic stenosis. Aortic valve mean gradient measures 4.0 mmHg. Aortic valve peak gradient measures 6.6 mmHg. Aortic valve area, by VTI measures 1.40 cm. Pulmonic Valve: The pulmonic valve was normal in structure. Pulmonic valve regurgitation is not visualized. No evidence of pulmonic stenosis. Aorta: The aortic root is normal in size and structure. Venous: The inferior vena cava is normal in size with greater than 50% respiratory variability, suggesting right atrial pressure of 3 mmHg. IAS/Shunts: No atrial level shunt detected by color flow Doppler.  LEFT VENTRICLE PLAX 2D LVIDd:          4.20 cm     Diastology LVIDs:         4.00 cm     LV e' medial:    5.44 cm/s LV PW:         1.10 cm     LV E/e' medial:  19.3 LV IVS:        1.00 cm     LV e' lateral:   6.20 cm/s LVOT diam:     2.10 cm     LV E/e' lateral: 16.9 LV SV:         29 LV SV Index:  18 LVOT Area:     3.46 cm  LV Volumes (MOD) LV vol d, MOD A2C: 80.7 ml LV vol d, MOD A4C: 99.7 ml LV vol s, MOD A2C: 53.9 ml LV vol s, MOD A4C: 57.8 ml LV SV MOD A2C:     26.8 ml LV SV MOD A4C:     99.7 ml LV SV MOD BP:      34.5 ml RIGHT VENTRICLE             IVC RV Basal diam:  2.70 cm     IVC diam: 1.50 cm RV S prime:     15.30 cm/s TAPSE (M-mode): 1.9 cm LEFT ATRIUM             Index        RIGHT ATRIUM           Index LA diam:        4.10 cm 2.54 cm/m   RA Area:     13.30 cm LA Vol (A2C):   44.8 ml 27.71 ml/m  RA Volume:   31.90 ml  19.73 ml/m LA Vol (A4C):   70.5 ml 43.60 ml/m LA Biplane Vol: 56.9 ml 35.19 ml/m  AORTIC VALVE AV Area (Vmax):    1.42 cm AV Area (Vmean):   1.30 cm AV Area (VTI):     1.40 cm AV Vmax:           128.00 cm/s AV Vmean:          89.200 cm/s AV VTI:            0.207 m AV Peak Grad:      6.6 mmHg AV Mean Grad:      4.0 mmHg LVOT Vmax:         52.30 cm/s LVOT Vmean:        33.400 cm/s LVOT VTI:          0.084 m LVOT/AV VTI ratio: 0.40 AI PHT:            530 msec  AORTA Ao Root diam: 2.90 cm MITRAL VALVE                TRICUSPID VALVE MV Area (PHT): 10.00 cm    TR Peak grad:   22.1 mmHg MV E velocity: 105.00 cm/s  TR Vmax:        235.00 cm/s MV A velocity: 55.70 cm/s MV E/A ratio:  1.89         SHUNTS                             Systemic VTI:  0.08 m                             Systemic Diam: 2.10 cm Toribio Fuel MD Electronically signed by Toribio Fuel MD Signature Date/Time: 04/08/2023/9:58:35 AM    Final    DG Chest 2 View Result Date: 04/07/2023 CLINICAL DATA:  Shortness of breath. EXAM: CHEST - 2 VIEW COMPARISON:  08/09/2006 FINDINGS: Cardiopericardial silhouette is at upper limits of normal for size. Basilar  predominant interstitial with some potential minimal airspace disease at the right base. No substantial pleural effusion. Bones are diffusely demineralized. Telemetry leads overlie the chest. IMPRESSION: Basilar predominant interstitial opacity bilaterally, right greater than left. Findings likely reflect chronic changes although a component of superimposed edema is not excluded. Subtle airspace disease in  the extreme right lung base compatible with atelectasis or infection. Electronically Signed   By: Camellia Candle M.D.   On: 04/07/2023 05:12    Microbiology: No results found for this or any previous visit.  Labs: CBC: Recent Labs  Lab 04/07/23 0438 04/09/23 0229  WBC 10.3 6.7  HGB 14.4 13.9  HCT 43.7 42.8  MCV 89.7 91.1  PLT 321 297   Basic Metabolic Panel: Recent Labs  Lab 04/07/23 0600 04/08/23 0244 04/09/23 0229 04/10/23 0256  NA 139 140 139 137  K 4.0 3.4* 4.3 4.0  CL 108 103 106 103  CO2 25 26 23 23   GLUCOSE 115* 110* 110* 115*  BUN 19 17 17 18   CREATININE 0.83 0.97 0.84 0.94  CALCIUM  9.0 8.8* 9.0 9.1   Liver Function Tests: Recent Labs  Lab 04/07/23 0600 04/10/23 0256  AST 20 16  ALT 15 12  ALKPHOS 54 53  BILITOT 0.9 0.7  PROT 6.7 6.0*  ALBUMIN 3.6 3.2*   CBG: No results for input(s): GLUCAP in the last 168 hours.  Discharge time spent: less than 30 minutes.  Signed: Garnette Pelt, MD [facility] 04/10/2023

## 2023-04-10 NOTE — Progress Notes (Signed)
 Mobility Specialist Progress Note:   04/10/23 1000  Mobility  Activity Ambulated with assistance in hallway  Level of Assistance Standby assist, set-up cues, supervision of patient - no hands on  Assistive Device None  Distance Ambulated (ft) 350 ft  Activity Response Tolerated well  Mobility Referral Yes  Mobility visit 1 Mobility  Mobility Specialist Start Time (ACUTE ONLY) 1000  Mobility Specialist Stop Time (ACUTE ONLY) 1015  Mobility Specialist Time Calculation (min) (ACUTE ONLY) 15 min   Pt agreeable to mobility session. Required no physical assistance throughout ambulation. Denies SOB. Pt back in bed with all needs met.   Therisa Rana Mobility Specialist Please contact via SecureChat or  Rehab office at [phone]

## 2023-04-10 NOTE — TOC Transition Note (Signed)
 Transition of Care [facility]) - Discharge Note   Patient Details  Name: Heba Ige MRN: [id number] Date of Birth: August 15, 1940  [facility] CM/SW Contact:  Marval Gell, RN Phone Number: 04/10/2023, 12:34 PM   Clinical Narrative:     Janese Dux that patient will DC today   Final next level of care: Home w Home Health Services Barriers to Discharge: Continued Medical Work up   Patient Goals and CMS Choice Patient states their goals for this hospitalization and ongoing recovery are:: return home CMS Medicare.gov Compare Post Acute Care list provided to:: Patient Choice offered to / list presented to : Patient      Discharge Placement                       Discharge Plan and Services Additional resources added to the After Visit Summary for   In-house Referral: NA Discharge Planning Services: CM Consult Post Acute Care Choice: Home Health, Durable Medical Equipment          DME Arranged: N/A DME Agency: NA       HH Arranged: RN, Disease Management, PT HH Agency: [facility] Date [facility] Agency Contacted: 04/08/23 Time HH Agency Contacted: 1413 Representative spoke with at [facility] Agency: Arna  Social Drivers of Health (SDOH) Interventions SDOH Screenings   Food Insecurity: No Food Insecurity (04/07/2023)  Housing: Low Risk  (04/07/2023)  Transportation Needs: No Transportation Needs (04/07/2023)  Utilities: Not At Risk (04/07/2023)  Tobacco Use: Medium Risk (04/07/2023)     Readmission Risk Interventions     No data to display
